# Patient Record
Sex: Female | Born: 1945 | Race: White | Hispanic: No | Marital: Married | Smoking: Never smoker
Health system: Southern US, Community
[De-identification: ages and names within clinical notes are randomized; demographics above are authoritative.]

## PROBLEM LIST (undated history)

## (undated) DIAGNOSIS — E039 Hypothyroidism, unspecified: Secondary | ICD-10-CM

## (undated) DIAGNOSIS — R5383 Other fatigue: Secondary | ICD-10-CM

## (undated) DIAGNOSIS — N950 Postmenopausal bleeding: Secondary | ICD-10-CM

## (undated) DIAGNOSIS — E559 Vitamin D deficiency, unspecified: Secondary | ICD-10-CM

## (undated) DIAGNOSIS — J309 Allergic rhinitis, unspecified: Secondary | ICD-10-CM

## (undated) DIAGNOSIS — R87619 Unspecified abnormal cytological findings in specimens from cervix uteri: Secondary | ICD-10-CM

## (undated) DIAGNOSIS — E279 Disorder of adrenal gland, unspecified: Secondary | ICD-10-CM

## (undated) DIAGNOSIS — M8580 Other specified disorders of bone density and structure, unspecified site: Secondary | ICD-10-CM

## (undated) DIAGNOSIS — L853 Xerosis cutis: Secondary | ICD-10-CM

## (undated) DIAGNOSIS — N9489 Other specified conditions associated with female genital organs and menstrual cycle: Secondary | ICD-10-CM

## (undated) DIAGNOSIS — B3782 Candidal enteritis: Secondary | ICD-10-CM

## (undated) DIAGNOSIS — Z91018 Allergy to other foods: Secondary | ICD-10-CM

## (undated) DIAGNOSIS — N6019 Diffuse cystic mastopathy of unspecified breast: Secondary | ICD-10-CM

## (undated) DIAGNOSIS — G4700 Insomnia, unspecified: Secondary | ICD-10-CM

## (undated) DIAGNOSIS — Z8619 Personal history of other infectious and parasitic diseases: Secondary | ICD-10-CM

## (undated) DIAGNOSIS — R21 Rash and other nonspecific skin eruption: Secondary | ICD-10-CM

## (undated) HISTORY — DX: Rash and other nonspecific skin eruption: R21

## (undated) HISTORY — DX: Other specified disorders of bone density and structure, unspecified site: M85.80

## (undated) HISTORY — DX: Postmenopausal bleeding: N95.0

## (undated) HISTORY — DX: Allergy to other foods: Z91.018

## (undated) HISTORY — DX: Personal history of other infectious and parasitic diseases: Z86.19

## (undated) HISTORY — DX: Other specified conditions associated with female genital organs and menstrual cycle: N94.89

## (undated) HISTORY — DX: Vitamin D deficiency, unspecified: E55.9

## (undated) HISTORY — DX: Hypothyroidism, unspecified: E03.9

## (undated) HISTORY — PX: DILATION AND CURETTAGE OF UTERUS: SHX78

## (undated) HISTORY — DX: Disorder of adrenal gland, unspecified: E27.9

## (undated) HISTORY — PX: TONSILLECTOMY AND ADENOIDECTOMY: SHX28

## (undated) HISTORY — DX: Other fatigue: R53.83

## (undated) HISTORY — DX: Unspecified abnormal cytological findings in specimens from cervix uteri: R87.619

## (undated) HISTORY — DX: Diffuse cystic mastopathy of unspecified breast: N60.19

## (undated) HISTORY — DX: Insomnia, unspecified: G47.00

## (undated) HISTORY — DX: Xerosis cutis: L85.3

## (undated) HISTORY — DX: Allergic rhinitis, unspecified: J30.9

## (undated) HISTORY — DX: Candidal enteritis: B37.82

---
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
## 1997-10-06 ENCOUNTER — Emergency Department (HOSPITAL_COMMUNITY): Admission: EM | Admit: 1997-10-06 | Discharge: 1997-10-06 | Payer: Self-pay

## 2004-01-09 ENCOUNTER — Other Ambulatory Visit: Admission: RE | Admit: 2004-01-09 | Discharge: 2004-01-09 | Payer: Self-pay | Admitting: Family Medicine

## 2004-11-07 ENCOUNTER — Encounter: Admission: RE | Admit: 2004-11-07 | Discharge: 2004-11-07 | Payer: Self-pay | Admitting: Internal Medicine

## 2008-06-29 ENCOUNTER — Other Ambulatory Visit: Admission: RE | Admit: 2008-06-29 | Discharge: 2008-06-29 | Payer: Self-pay | Admitting: Family Medicine

## 2008-12-24 ENCOUNTER — Ambulatory Visit: Payer: Self-pay | Admitting: Diagnostic Radiology

## 2008-12-24 ENCOUNTER — Emergency Department (HOSPITAL_BASED_OUTPATIENT_CLINIC_OR_DEPARTMENT_OTHER): Admission: EM | Admit: 2008-12-24 | Discharge: 2008-12-24 | Payer: Self-pay | Admitting: Emergency Medicine

## 2010-06-12 LAB — URINALYSIS, ROUTINE W REFLEX MICROSCOPIC
Glucose, UA: NEGATIVE mg/dL
Hgb urine dipstick: NEGATIVE
Specific Gravity, Urine: 1.005 (ref 1.005–1.030)
Urobilinogen, UA: 0.2 mg/dL (ref 0.0–1.0)
pH: 7.5 (ref 5.0–8.0)

## 2010-06-12 LAB — COMPREHENSIVE METABOLIC PANEL
AST: 34 U/L (ref 0–37)
Albumin: 4.9 g/dL (ref 3.5–5.2)
Alkaline Phosphatase: 93 U/L (ref 39–117)
CO2: 23 mEq/L (ref 19–32)
Chloride: 101 mEq/L (ref 96–112)
Total Protein: 8.2 g/dL (ref 6.0–8.3)

## 2010-06-12 LAB — DIFFERENTIAL
Basophils Absolute: 0.1 10*3/uL (ref 0.0–0.1)
Basophils Relative: 1 % (ref 0–1)
Eosinophils Absolute: 0.4 10*3/uL (ref 0.0–0.7)
Eosinophils Relative: 4 % (ref 0–5)
Lymphocytes Relative: 38 % (ref 12–46)
Monocytes Relative: 8 % (ref 3–12)
Neutro Abs: 4.2 10*3/uL (ref 1.7–7.7)
Neutrophils Relative %: 49 % (ref 43–77)

## 2010-06-12 LAB — URINE CULTURE: Colony Count: NO GROWTH

## 2010-06-12 LAB — POCT CARDIAC MARKERS
CKMB, poc: 1.3 ng/mL (ref 1.0–8.0)
Myoglobin, poc: 62.7 ng/mL (ref 12–200)

## 2010-06-12 LAB — CBC
Platelets: 243 10*3/uL (ref 150–400)
WBC: 8.8 10*3/uL (ref 4.0–10.5)

## 2010-06-12 LAB — TSH: TSH: 4.717 u[IU]/mL — ABNORMAL HIGH (ref 0.350–4.500)

## 2010-06-12 LAB — URINE MICROSCOPIC-ADD ON

## 2013-01-02 ENCOUNTER — Ambulatory Visit (INDEPENDENT_AMBULATORY_CARE_PROVIDER_SITE_OTHER): Payer: Medicare Other | Admitting: Family Medicine

## 2013-01-02 DIAGNOSIS — T6111XA Scombroid fish poisoning, accidental (unintentional), initial encounter: Secondary | ICD-10-CM

## 2013-01-02 DIAGNOSIS — T6191XA Toxic effect of unspecified seafood, accidental (unintentional), initial encounter: Secondary | ICD-10-CM

## 2013-01-02 DIAGNOSIS — T382X4A Poisoning by antithyroid drugs, undetermined, initial encounter: Secondary | ICD-10-CM

## 2013-01-02 DIAGNOSIS — R21 Rash and other nonspecific skin eruption: Secondary | ICD-10-CM

## 2013-01-02 MED ORDER — PREDNISONE 20 MG PO TABS: 20.0000 mg | ORAL_TABLET | Freq: Every day | ORAL | Status: DC

## 2013-01-02 NOTE — Patient Instructions (Signed)
Take the combination of Benadryl and Ranitidine (Pepcid).  Take the Benadryl at night to help with sleep.  Take Allegra or Zyrtec in the day as this won't make you sleepy.    Do this for about a week.  I will send in steroids for you as well if you need them.  It's only for 5 days.  It could either be scombroid poisoning or an allergic reaction to iodine.  Checking thyroid today.    Call if no improvement in 48 hours.  If you have any trouble breathing, mouth tingling or swelling, go straight to the emergency room.

## 2013-01-02 NOTE — Progress Notes (Signed)
Taylor Bolton is a 67 y.o. female who presents to Urgent Care today with complaints of Rash:    1.  Rash:  Present for past 3 weeks.  Started on hands BL.  Had eaten shrimp earlier that day.  Stated total seafood diet plus high dose fish oil and rash quickly spread to arms, upper chest, breasts, and face.  Itching and burning.  Has tried several OTC supplements without relief, cannot recall all of them.  No antihistamines.  No nausea, vomiting.  Also taking iodine supplements and iodine salts during this time.   No trouble breathing, itching in mouth, tingling, swelling, trouble with airway.  No change/new meds.  Still taking levothroid through this.    PMH reviewed.  Past Medical History  Diagnosis Date  . Hypothyroidism    History reviewed. No pertinent past surgical history.  Medications reviewed. Current Outpatient Prescriptions  Medication Sig Dispense Refill  . levothyroxine (SYNTHROID, LEVOTHROID) 88 MCG tablet Take 88 mcg by mouth daily before breakfast.      . progesterone (PROMETRIUM) 100 MG capsule Take 100 mg by mouth daily.       No current facility-administered medications for this visit.    ROS as above otherwise neg.  No chest pain, palpitations, SOB, Fever, Chills, Abd pain, N/V/D.   Physical Exam:  BP 118/64  Pulse 82  Temp(Src) 99.2 F (37.3 C)  Resp 18  Ht 5\' 5"  (1.651 m)  Wt 130 lb (58.968 kg)  BMI 21.63 kg/m2  SpO2 100% Gen:  Alert, cooperative patient who appears stated age in no acute distress.  Vital signs reviewed. HEENT: EOMI,  MMM.  No exfoliation of oral mucosa.   Pulm:  Clear to auscultation bilaterally with good air movement.  No wheezes or rales noted.   Cardiac:  Regular rate and rhythm without murmur auscultated.  Good S1/S2. Skin:  Urticarial rash throughout hands, arms, shoulders, uper breast, face.  Spares bridge of nose and cheeks.  Present on forehead.  No exfolation noted.  Spares mouth.  Spares legs and abdomen and most of back.  Nurse  chaperone present for entire exam.  Assessment and Plan:  1.  Rash: - scombroid vs iodine allergy, treatment same for each - will add iodine to allergy list for safety - check TSH, not likely to cause thyroid issues, but sounds like she's had significant intake - histamines plus steroids - call if no improvement or worsening.  - no red flags as no mucosal invovlement.

## 2013-01-03 LAB — TSH: TSH: 5.404 u[IU]/mL — ABNORMAL HIGH (ref 0.350–4.500)

## 2013-01-08 ENCOUNTER — Telehealth: Payer: Self-pay

## 2013-01-08 NOTE — Telephone Encounter (Signed)
Please advise 

## 2013-01-08 NOTE — Telephone Encounter (Signed)
PATIENT STATES SHE SAW DR. Gwendolyn Grant LAST Monday FOR A RASH. HE PRESCRIBED HER 5 DAYS OF PREDNISONE, BUT WHEN SHE GOT THE PRESCRIPTION FILLED IT SAID SHE IS TO TAKE IT FOR 10 DAYS. SHE WOULD LIKE TO KNOW WHICH WAY IS CORRECT? BEST PHONE [phone] (CELL)   PHARMACY CHOICE IS [facility] [address].  MBC

## 2013-01-09 NOTE — Telephone Encounter (Signed)
Called her to advise to only take for 5 days.

## 2013-01-09 NOTE — Telephone Encounter (Signed)
Pt instructions indicate that pt was take for 5 days only.  Would follow those instructions.

## 2013-02-03 ENCOUNTER — Other Ambulatory Visit: Payer: Self-pay | Admitting: Family Medicine

## 2013-02-12 ENCOUNTER — Encounter: Payer: Self-pay | Admitting: Family Medicine

## 2013-02-12 ENCOUNTER — Ambulatory Visit (INDEPENDENT_AMBULATORY_CARE_PROVIDER_SITE_OTHER): Payer: Medicare Other | Admitting: Family Medicine

## 2013-02-12 VITALS — BP 100/64 | HR 92 | Temp 97.7°F | Resp 16 | Ht 65.5 in | Wt 126.4 lb

## 2013-02-12 DIAGNOSIS — L239 Allergic contact dermatitis, unspecified cause: Secondary | ICD-10-CM

## 2013-02-12 DIAGNOSIS — L259 Unspecified contact dermatitis, unspecified cause: Secondary | ICD-10-CM

## 2013-02-12 MED ORDER — PREDNISONE 20 MG PO TABS: ORAL_TABLET | ORAL | Status: DC

## 2013-02-12 MED ORDER — FLUOCINONIDE-E 0.05 % EX CREA: 1.0000 "application " | TOPICAL_CREAM | Freq: Two times a day (BID) | CUTANEOUS | Status: DC

## 2013-02-12 NOTE — Progress Notes (Signed)
° °  Subjective:  This chart was scribed for Elvina Sidle, MD by Carl Best, Medical Scribe. This patient was seen in Room 9 and the patient's care was started at 11:43 AM.  Patient ID: Taylor Bolton, female    DOB: November 04, 1945, 67 y.o.   MRN: [id number]  HPI HPI Comments: Taylor Bolton is a 67 y.o. female who [facility] complaining of an itchy, erythematous rash located on her bilateral arms, bilateral shoulders, neck, and back caused by an allergic reaction to shellfish that started yesterday.  She states that she had the shellfish on Friday evening.  She states that she has taken Benadryl with no relief to her symptoms.  She denies tongue and throat swelling as associated symptoms.  She states that she had similar symptoms a couple of months ago and was given Prednisone pills for her symptoms.  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    Past Medical History  Diagnosis Date   Hypothyroidism    No past surgical history on file. Family History  Problem Relation Age of Onset   Cancer Mother    Cancer Father    History   Social History   Marital Status: Married    Spouse Name: N/A    Number of Children: N/A   Years of Education: N/A   Occupational History   Not on file.   Social History Main Topics   Smoking status: Never Smoker    Smokeless tobacco: Not on file   Alcohol Use: Not on file   Drug Use: Not on file   Sexual Activity: Not on file   Other Topics Concern   Not on file   Social History Narrative   No narrative on file   Allergies  Allergen Reactions   Iodine (Kelp) [Iodine]    Sudafed [Pseudoephedrine Hcl]      Review of Systems  HENT: Negative for facial swelling and trouble swallowing.   Skin: Positive for color change and rash.  All other systems reviewed and are negative.     Objective:  Physical Exam No acute distress Marked eczematous changes with excoriated skin both upper  extremities all the way to the shoulder and including shoulders, neck, and face. Skin is crying and  intensely red Oropharynx: Clear Chest: Clear   Assessment & Plan:   I personally performed the services described in this documentation, which was scribed in my presence. The recorded information has been reviewed and is accurate.  Changes consistent with having had shellfish in developing an eczematous, allergic reaction   Allergic dermatitis - Plan: predniSONE (DELTASONE) 20 MG tablet, fluocinonide-emollient (LIDEX-E) 0.05 % cream  Signed, Elvina Sidle, MD

## 2013-02-12 NOTE — Patient Instructions (Signed)

## 2013-10-19 ENCOUNTER — Encounter: Payer: Self-pay | Admitting: Family Medicine

## 2013-11-21 ENCOUNTER — Encounter: Payer: Self-pay | Admitting: Family Medicine

## 2013-11-21 ENCOUNTER — Ambulatory Visit (INDEPENDENT_AMBULATORY_CARE_PROVIDER_SITE_OTHER): Payer: Medicare HMO | Admitting: Family Medicine

## 2013-11-21 VITALS — BP 125/73 | HR 64 | Temp 98.4°F | Resp 18 | Ht 64.5 in | Wt 125.0 lb

## 2013-11-21 DIAGNOSIS — N959 Unspecified menopausal and perimenopausal disorder: Secondary | ICD-10-CM

## 2013-11-21 DIAGNOSIS — E039 Hypothyroidism, unspecified: Secondary | ICD-10-CM

## 2013-11-21 DIAGNOSIS — N951 Menopausal and female climacteric states: Secondary | ICD-10-CM

## 2013-11-21 NOTE — Assessment & Plan Note (Signed)
Continue hormone rx as per Dr. Alessandra Bevels, her integrative med MD. I told pt to request pap smear by Dr. Alessandra Bevels for cervical cancer screening since she rx's her hormones.

## 2013-11-21 NOTE — Progress Notes (Signed)
Pre visit review using our clinic review tool, if applicable. No additional management support is needed unless otherwise documented below in the visit note. 

## 2013-11-21 NOTE — Assessment & Plan Note (Signed)
Last TSH 9 mo ago was great/wnl. Continue synthroid 88 mcg qd. We'll recheck level in 6 mo if she has not already had this done via Dr. Martyn Ehrich office.

## 2013-11-21 NOTE — Progress Notes (Signed)
Office Note 11/21/2013  CC:  Chief Complaint  Patient presents with  . Establish Care    HPI:  Taylor Bolton is a 68 y.o. White female who is here to establish care. Patient's most recent primary MD: Dr. Alessandra Bevels, integrative medicine--she will still see her for an annual visit and her hormone prescription. Old records in [facility] EMR were reviewed prior to or during today's visit.  She has no acute complaints today.  Past Medical History  Diagnosis Date  . Hypothyroidism   . Rash and nonspecific skin eruption     Initially treated as allergic dermatitis but didn't help; then her integrative med MD dx'd it as some sort of yeast infection and it cleared up with diflucan  . Allergic rhinitis     Perennial; with PND; treats with "natural" supplements    Past Surgical History  Procedure Laterality Date  . Tonsillectomy and adenoidectomy  1950s    Family History  Problem Relation Age of Onset  . Breast cancer Mother     dx'd in her 83s.  . Prostate cancer Father     History   Social History  . Marital Status: Married    Spouse Name: N/A    Number of Children: N/A  . Years of Education: N/A   Occupational History  . Not on file.   Social History Main Topics  . Smoking status: Never Smoker   . Smokeless tobacco: Never Used  . Alcohol Use: No  . Drug Use: No  . Sexual Activity: Not on file   Other Topics Concern  . Not on file   Social History Narrative   Married, 1 son and 1 daughter.  1 grandson.   Retired Interior and spatial designer.   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   No T/A/Ds.   Exercise: nothing consistent   MEDS: Levothyroxine 88 mcg qd,  Also takes biest , (estriol testosterone )--rx'd by her integrative med MD  Allergies  Allergen Reactions  . Sudafed [Pseudoephedrine Hcl]     ROS Review of Systems  Constitutional: Negative for fever and fatigue.  HENT: Negative for congestion and sore throat.   Eyes: Negative for visual disturbance.  Respiratory:  Negative for cough.   Cardiovascular: Negative for chest pain.  Gastrointestinal: Negative for nausea and abdominal pain.  Genitourinary: Negative for dysuria.  Musculoskeletal: Negative for back pain and joint swelling.  Skin: Negative for rash.  Neurological: Negative for weakness and headaches.  Hematological: Negative for adenopathy.    PE; Blood pressure 125/73, pulse 64, temperature 98.4 F (36.9 C), temperature source Temporal, resp. rate 18, height 5' 4.5" (1.638 m), weight 125 lb (56.7 kg), SpO2 96.00%. Gen: Alert, well appearing.  Patient is oriented to person, place, time, and situation. WUJ:WJXB: no injection, icteris, swelling, or exudate.  EOMI, PERRLA. Mouth: lips without lesion/swelling.  Oral mucosa pink and moist. Oropharynx without erythema, exudate, or swelling.  Neck: supple/nontender.  No LAD, mass, or TM.  Carotid pulses 2+ bilaterally, without bruits. CV: RRR, no m/r/g.   LUNGS: CTA bilat, nonlabored resps, good aeration in all lung fields. EXT: no clubbing, cyanosis, or edema.   Pertinent labs:  Reviewed latest labs from Integrative med MD, Dr. Alessandra Bevels (03/13/2013): CMET, TSH, free T4, T3, DHEA sulfate, vit D, Vit A, histamine levels, reverse T3, and glutathione:  All wnl.  ASSESSMENT AND PLAN:   New pt; here to establish care.  Will obtain old records.  Hypothyroidism Last TSH 9 mo ago was great/wnl. Continue synthroid 88 mcg qd. We'll  recheck level in 6 mo if she has not already had this done via Dr. Martyn Ehrich office.  Postmenopausal disorder Continue hormone rx as per Dr. Alessandra Bevels, her integrative med MD. I told pt to request pap smear by Dr. Alessandra Bevels for cervical cancer screening since she rx's her hormones.   When pt returns for AWV I'll give her iFOB (she declines colonoscopy but agrees to iFOB). She declines cholesterol screening.  She does not want to pursue mammography at this time (last mammogram was about 2010.  She had a "thermogram" about 2012  which she reports was normal).  An After Visit Summary was printed and given to the patient.  Return in about 6 months (around 05/22/2014) for annual medicare wellness visit.

## 2013-12-13 ENCOUNTER — Encounter: Payer: Self-pay | Admitting: Family Medicine

## 2013-12-19 ENCOUNTER — Encounter: Payer: Self-pay | Admitting: Family Medicine

## 2014-01-02 ENCOUNTER — Ambulatory Visit
Admission: RE | Admit: 2014-01-02 | Discharge: 2014-01-02 | Disposition: A | Discharge status: Home / Self Care | Payer: Medicare HMO | Source: Ambulatory Visit | Attending: *Deleted | Admitting: *Deleted

## 2014-01-02 ENCOUNTER — Other Ambulatory Visit: Payer: Self-pay | Admitting: *Deleted

## 2014-01-02 DIAGNOSIS — J069 Acute upper respiratory infection, unspecified: Secondary | ICD-10-CM

## 2014-02-21 ENCOUNTER — Telehealth: Payer: Self-pay | Admitting: Family Medicine

## 2014-02-22 NOTE — Telephone Encounter (Signed)
Patient advised that our 1st available new patient appointment is not until March. Patient states that she will try somewhere else first.

## 2014-03-12 ENCOUNTER — Ambulatory Visit: Payer: Medicare HMO | Admitting: Nurse Practitioner

## 2014-03-16 ENCOUNTER — Encounter: Payer: Self-pay | Admitting: Nurse Practitioner

## 2014-03-16 ENCOUNTER — Ambulatory Visit (INDEPENDENT_AMBULATORY_CARE_PROVIDER_SITE_OTHER): Payer: Medicare HMO | Admitting: Nurse Practitioner

## 2014-03-16 ENCOUNTER — Other Ambulatory Visit (HOSPITAL_COMMUNITY)
Admission: RE | Admit: 2014-03-16 | Discharge: 2014-03-16 | Disposition: A | Discharge status: Home / Self Care | Payer: Medicare HMO | Source: Ambulatory Visit | Attending: Nurse Practitioner | Admitting: Nurse Practitioner

## 2014-03-16 VITALS — BP 106/68 | HR 74 | Temp 98.2°F | Ht 64.5 in | Wt 127.0 lb

## 2014-03-16 DIAGNOSIS — Z124 Encounter for screening for malignant neoplasm of cervix: Secondary | ICD-10-CM | POA: Insufficient documentation

## 2014-03-16 DIAGNOSIS — Z1151 Encounter for screening for human papillomavirus (HPV): Secondary | ICD-10-CM | POA: Diagnosis present

## 2014-03-16 DIAGNOSIS — Z1239 Encounter for other screening for malignant neoplasm of breast: Secondary | ICD-10-CM | POA: Insufficient documentation

## 2014-03-16 DIAGNOSIS — N8110 Cystocele, unspecified: Secondary | ICD-10-CM

## 2014-03-16 NOTE — Progress Notes (Signed)
Pre visit review using our clinic review tool, if applicable. No additional management support is needed unless otherwise documented below in the visit note. 

## 2014-03-16 NOTE — Progress Notes (Signed)
Subjective:     Taylor Bolton is a 69 y.o. female [facility]. The patient reports no problems. [facility]. Had MMG few years ago, then thermogram -recalls nml. She sees Dr Alessandra Bevelsvaughn for HRT-uses estrogen vaginal cream & progesterone patches. Estrogen cream has helped w/vaginal atrophy/dryness/discomfort. Pertinent fam Hx: mother breast ca in 3070's.  History   Social History  . Marital Status: Married    Spouse Name: N/A    Number of Children: 2  . Years of Education: N/A   Occupational History  . Not on file.   Social History Main Topics  . Smoking status: Never Smoker   . Smokeless tobacco: Never Used  . Alcohol Use: No  . Drug Use: No  . Sexual Activity: Not on file   Other Topics Concern  . Not on file   Social History Narrative   Married, 1 son and 1 daughter.  1 grandson.   Retired Interior and spatial designerhairdresser.   [place] from [place]   No T/A/Ds.   Exercise: nothing consistent   Health Maintenance  Topic Date Due  . TETANUS/TDAP  10/17/1966  . MAMMOGRAM  10/16/1997  . COLONOSCOPY  10/16/1997  . ZOSTAVAX  10/17/2007  . INFLUENZA VACCINE  06/07/2014 (Originally 10/07/2013)  . PNEUMOCOCCAL POLYSACCHARIDE VACCINE AGE 73 AND OVER  06/06/2068 (Originally 10/16/2012)  . DEXA SCAN  Completed    The following portions of the patient's history were reviewed and updated as appropriate: allergies, current medications, past family history, past medical history, past social history, past surgical history and problem list.  Review of Systems Constitutional: negative for fatigue, fevers, night sweats and sweats Respiratory: negative for cough and dyspnea on exertion Cardiovascular: negative for irregular heart beat and lower extremity edema Genitourinary:positive for stress incontinence   Objective:    BP 106/68 mmHg  Pulse 74  Temp(Src) 98.2 F (36.8 C) (Oral)  Ht 5' 4.5" (1.638 m)  Wt 127 lb (57.607 kg)  BMI 21.47 kg/m2  SpO2 99% General  appearance: alert, cooperative, appears stated age and no distress Head: Normocephalic, without obvious abnormality, atraumatic Eyes: negative findings: lids and lashes normal and conjunctivae and sclerae normal Lungs: clear to auscultation bilaterally Breasts: No nipple retraction or dimpling, No nipple discharge or bleeding, No axillary or supraclavicular adenopathy, some ropey, ovoid tissue palpated bilat Heart: regular rate and rhythm, S1, S2 normal, no murmur, click, rub or gallop Pelvic: adnexae not palpable, cervix normal in appearance, external genitalia normal, no adnexal masses or tenderness, no bladder tenderness, no cervical motion tenderness, perianal skin: no external genital warts noted, uterus normal size, shape, and consistency, vagina normal without discharge and mild bladder prolapse    Assessment:Plan   1. Cervical cancer screening - Cytology - PAP  2. Breast cancer screening - MM DIGITAL SCREENING BILATERAL; Future  3. Bladder prolapse, female, acquired kegels 12-16-08 daily Bladder training-empty q2h  See pt instructions Needs wellness exam w/Dr Milinda CaveMcGowen

## 2014-03-16 NOTE — Patient Instructions (Signed)
My office will call with lab results.  Consider scheduling mammogram for earliest cancer screening & best treatment outcomes.  Consider scheduling wellness exam w/Dr McGowen.  Nice to meet you!

## 2014-03-19 ENCOUNTER — Telehealth: Payer: Self-pay | Admitting: Nurse Practitioner

## 2014-03-19 LAB — CYTOLOGY - PAP

## 2014-03-19 NOTE — Telephone Encounter (Signed)
pls call pt: Advise Pap was normal.

## 2014-03-19 NOTE — Telephone Encounter (Signed)
Patient notified of results.

## 2014-03-22 ENCOUNTER — Ambulatory Visit
Admission: RE | Admit: 2014-03-22 | Discharge: 2014-03-22 | Disposition: A | Discharge status: Home / Self Care | Payer: Medicare HMO | Source: Ambulatory Visit | Attending: Nurse Practitioner | Admitting: Nurse Practitioner

## 2014-03-22 DIAGNOSIS — Z1239 Encounter for other screening for malignant neoplasm of breast: Secondary | ICD-10-CM

## 2014-03-28 ENCOUNTER — Other Ambulatory Visit: Payer: Self-pay | Admitting: Nurse Practitioner

## 2014-03-28 DIAGNOSIS — R928 Other abnormal and inconclusive findings on diagnostic imaging of breast: Secondary | ICD-10-CM

## 2014-04-05 ENCOUNTER — Ambulatory Visit
Admission: RE | Admit: 2014-04-05 | Discharge: 2014-04-05 | Disposition: A | Discharge status: Home / Self Care | Payer: Medicare HMO | Source: Ambulatory Visit | Attending: Nurse Practitioner | Admitting: Nurse Practitioner

## 2014-04-05 DIAGNOSIS — R928 Other abnormal and inconclusive findings on diagnostic imaging of breast: Secondary | ICD-10-CM

## 2014-05-31 ENCOUNTER — Ambulatory Visit: Payer: Medicare HMO | Admitting: Family Medicine

## 2014-06-29 ENCOUNTER — Encounter: Payer: Self-pay | Admitting: Family Medicine

## 2014-06-29 ENCOUNTER — Ambulatory Visit (INDEPENDENT_AMBULATORY_CARE_PROVIDER_SITE_OTHER): Payer: Medicare HMO | Admitting: Family Medicine

## 2014-06-29 VITALS — BP 106/66 | HR 66 | Temp 98.8°F | Resp 18 | Ht 64.5 in | Wt 124.0 lb

## 2014-06-29 DIAGNOSIS — Z79899 Other long term (current) drug therapy: Secondary | ICD-10-CM

## 2014-06-29 DIAGNOSIS — Z13220 Encounter for screening for lipoid disorders: Secondary | ICD-10-CM

## 2014-06-29 DIAGNOSIS — Z0000 Encounter for general adult medical examination without abnormal findings: Secondary | ICD-10-CM | POA: Diagnosis not present

## 2014-06-29 DIAGNOSIS — Z13820 Encounter for screening for osteoporosis: Secondary | ICD-10-CM

## 2014-06-29 DIAGNOSIS — M8580 Other specified disorders of bone density and structure, unspecified site: Secondary | ICD-10-CM

## 2014-06-29 DIAGNOSIS — Z131 Encounter for screening for diabetes mellitus: Secondary | ICD-10-CM | POA: Diagnosis not present

## 2014-06-29 LAB — COMPREHENSIVE METABOLIC PANEL
ALK PHOS: 49 U/L (ref 39–117)
ALT: 14 U/L (ref 0–35)
AST: 17 U/L (ref 0–37)
Albumin: 4.3 g/dL (ref 3.5–5.2)
BILIRUBIN TOTAL: 0.7 mg/dL (ref 0.2–1.2)
BUN: 12 mg/dL (ref 6–23)
CO2: 30 meq/L (ref 19–32)
Calcium: 9.5 mg/dL (ref 8.4–10.5)
Chloride: 100 mEq/L (ref 96–112)
Creatinine, Ser: 0.71 mg/dL (ref 0.40–1.20)
GFR: 87.35 mL/min (ref >60.00)
Glucose, Bld: 91 mg/dL (ref 70–99)
Potassium: 4.5 mEq/L (ref 3.5–5.1)
SODIUM: 134 meq/L — AB (ref 135–145)
TOTAL PROTEIN: 6.9 g/dL (ref 6.0–8.3)

## 2014-06-29 LAB — LIPID PANEL
Cholesterol: 204 mg/dL — ABNORMAL HIGH (ref 0–200)
HDL: 73.3 mg/dL (ref >39.00)
LDL Cholesterol: 113 mg/dL — ABNORMAL HIGH (ref 0–99)
NonHDL: 130.7
Total CHOL/HDL Ratio: 3
Triglycerides: 90 mg/dL (ref 0.0–149.0)
VLDL: 18 mg/dL (ref 0.0–40.0)

## 2014-06-29 NOTE — Patient Instructions (Signed)
Today we did screening for diabetes, hypercholesterolemia, and colon cancer (stool test). I also ordered your bone density test to screen for osteoporosis.  I'll note in your chart that you decline HIV screening and pneumonia vaccine at this time.

## 2014-06-29 NOTE — Progress Notes (Signed)
Pre visit review using our clinic review tool, if applicable. No additional management support is needed unless otherwise documented below in the visit note. 

## 2014-06-29 NOTE — Progress Notes (Signed)
The patient is here for annual Medicare wellness examination and management of other chronic and acute problems.   The risk factors are reflected in the social history.  The roster of all physicians providing medical care to patient - is listed in the Snapshot section of the chart. Dr. Mia CreekElizabeth Vaughan is pt's other physician, whom she sees for most of her concerns (she is an integrative medicine MD).  Activities of daily living:  The patient is 100% inedpendent in all ADLs: dressing, toileting, feeding as well as independent mobility  Home safety : The patient has smoke detectors in the home. They wear seatbelts.No firearms at home ( firearms are present in the home, kept in a safe fashion). There is no violence in the home.   There is no risks for hepatitis, STDs or HIV. There is no   history of blood transfusion. They have no travel history to infectious disease endemic areas of the world.  The patient has seen their dentist in the last six month. They have seen their eye doctor in the last year. They deny any hearing difficulty and have not had audiologic testing in the last year.  They do not  have excessive sun exposure. Discussed the need for sun protection: hats, long sleeves and use of sunscreen if there is significant sun exposure.   Diet: the importance of a healthy diet is discussed. They do have a healthy diet.  The patient does not have a regular exercise program.  The benefits of regular aerobic exercise were discussed.  Depression screen: there are no signs or vegative symptoms of depression- irritability, change in appetite, anhedonia, sadness/tearfullness.  Cognitive assessment: the patient manages all their financial and personal affairs and is actively engaged. They could relate day,date,year and events; recalled 3/3 objects at 3 minutes; performed clock-face test normally.  The following portions of the patient's history were reviewed and updated as appropriate:  allergies, current medications, past family history, past medical history,  past surgical history, past social history  and problem list.  Vision, hearing, body mass index were assessed and reviewed.  Filed Vitals:   06/29/14 0910  BP: 106/66  Pulse: 66  Temp: 98.8 F (37.1 C)  Resp: 18  BMI 21   During the course of the visit the patient was educated and counseled about appropriate screening and preventive services including : fall prevention , diabetes screening, nutrition counseling, colorectal cancer screening, and recommended immunizations.  A written screening schedule was given to pt today: Today we did screening for diabetes, hypercholesterolemia, and colon cancer (stool test). I also ordered your bone density test to screen for osteoporosis.  I'll note in your chart that you decline HIV screening and pneumonia vaccine at this time.

## 2014-06-29 NOTE — Addendum Note (Signed)
Addended by: Jeoffrey MassedMCGOWEN, Camdyn Laden H on: 06/29/2014 01:02 PM   Modules accepted: Orders

## 2014-07-18 ENCOUNTER — Ambulatory Visit (INDEPENDENT_AMBULATORY_CARE_PROVIDER_SITE_OTHER)
Admission: RE | Admit: 2014-07-18 | Discharge: 2014-07-18 | Disposition: A | Discharge status: Home / Self Care | Payer: Medicare HMO | Source: Ambulatory Visit | Attending: Family Medicine | Admitting: Family Medicine

## 2014-07-18 DIAGNOSIS — M8580 Other specified disorders of bone density and structure, unspecified site: Secondary | ICD-10-CM | POA: Diagnosis not present

## 2014-09-03 ENCOUNTER — Other Ambulatory Visit: Payer: Self-pay

## 2014-12-27 DIAGNOSIS — E039 Hypothyroidism, unspecified: Secondary | ICD-10-CM | POA: Diagnosis not present

## 2015-01-14 DIAGNOSIS — N951 Menopausal and female climacteric states: Secondary | ICD-10-CM | POA: Diagnosis not present

## 2015-01-14 DIAGNOSIS — E639 Nutritional deficiency, unspecified: Secondary | ICD-10-CM | POA: Diagnosis not present

## 2015-01-14 DIAGNOSIS — E039 Hypothyroidism, unspecified: Secondary | ICD-10-CM | POA: Diagnosis not present

## 2015-01-14 DIAGNOSIS — E559 Vitamin D deficiency, unspecified: Secondary | ICD-10-CM | POA: Diagnosis not present

## 2015-01-14 DIAGNOSIS — D508 Other iron deficiency anemias: Secondary | ICD-10-CM | POA: Diagnosis not present

## 2015-01-21 DIAGNOSIS — B029 Zoster without complications: Secondary | ICD-10-CM | POA: Diagnosis not present

## 2015-02-28 IMAGING — MG MM DIGITAL SCREENING BILAT W/ CAD
4 series · 4 of 4 positions shown · non-contrast
Comparison: No prior films

CLINICAL DATA: Screening.

EXAM:
DIGITAL SCREENING BILATERAL MAMMOGRAM WITH CAD

[L CC]
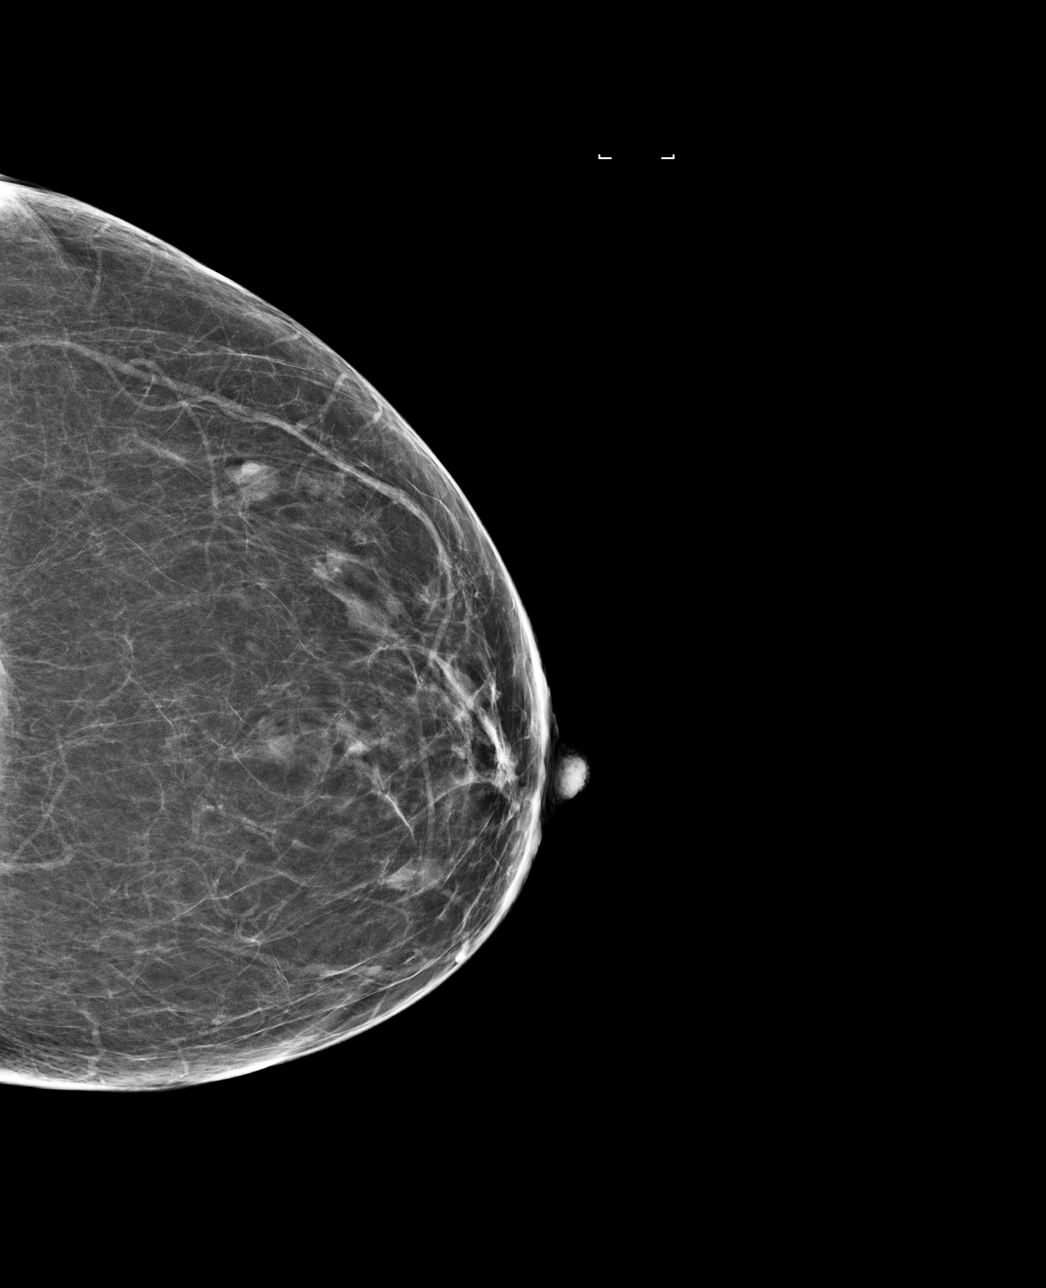

[L MLO]
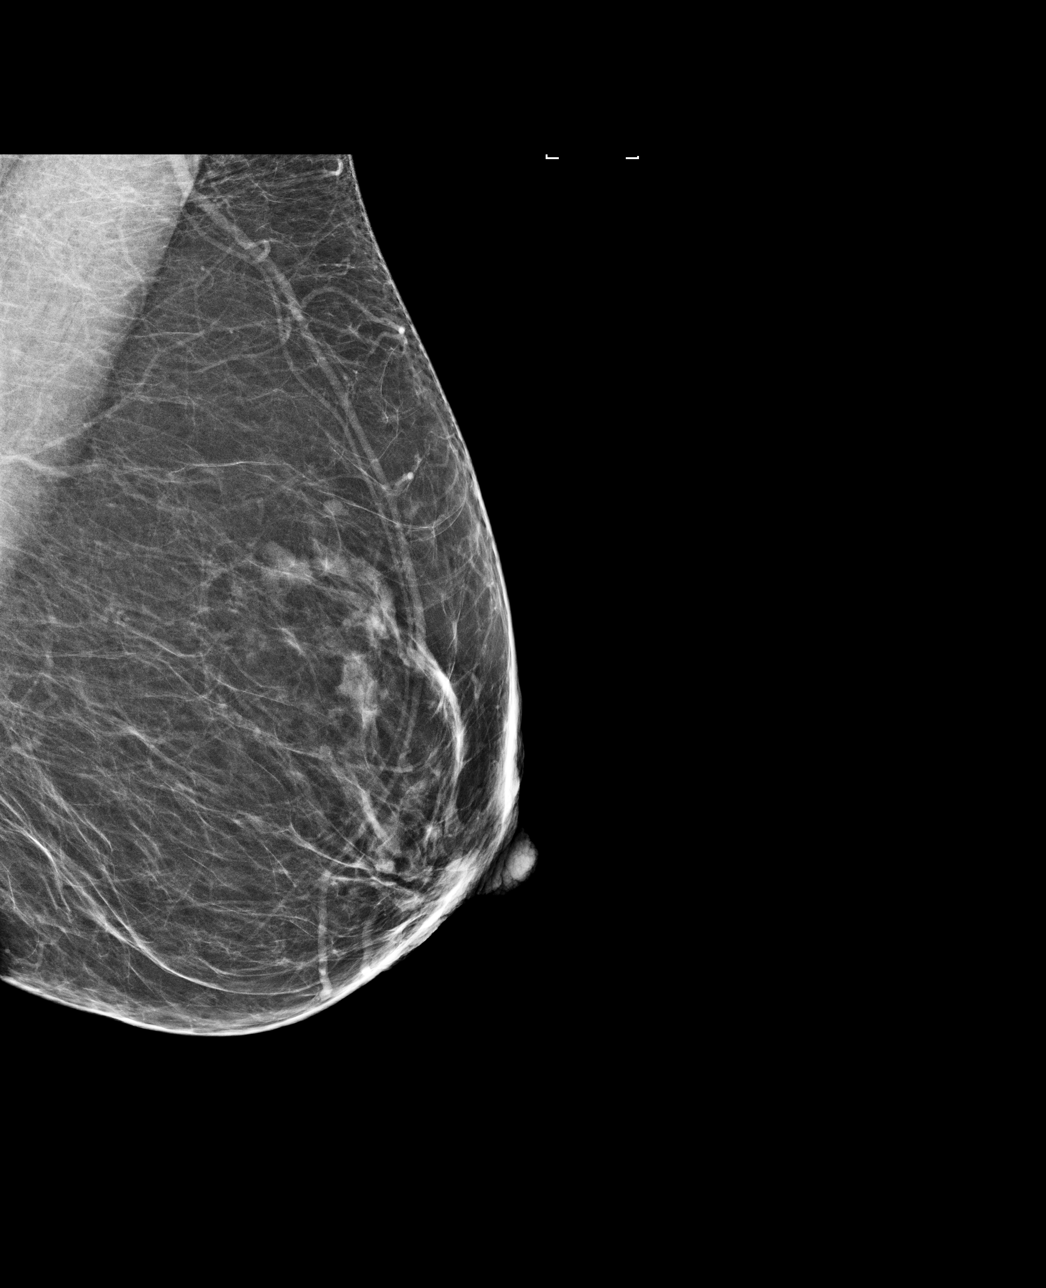

[R CC]
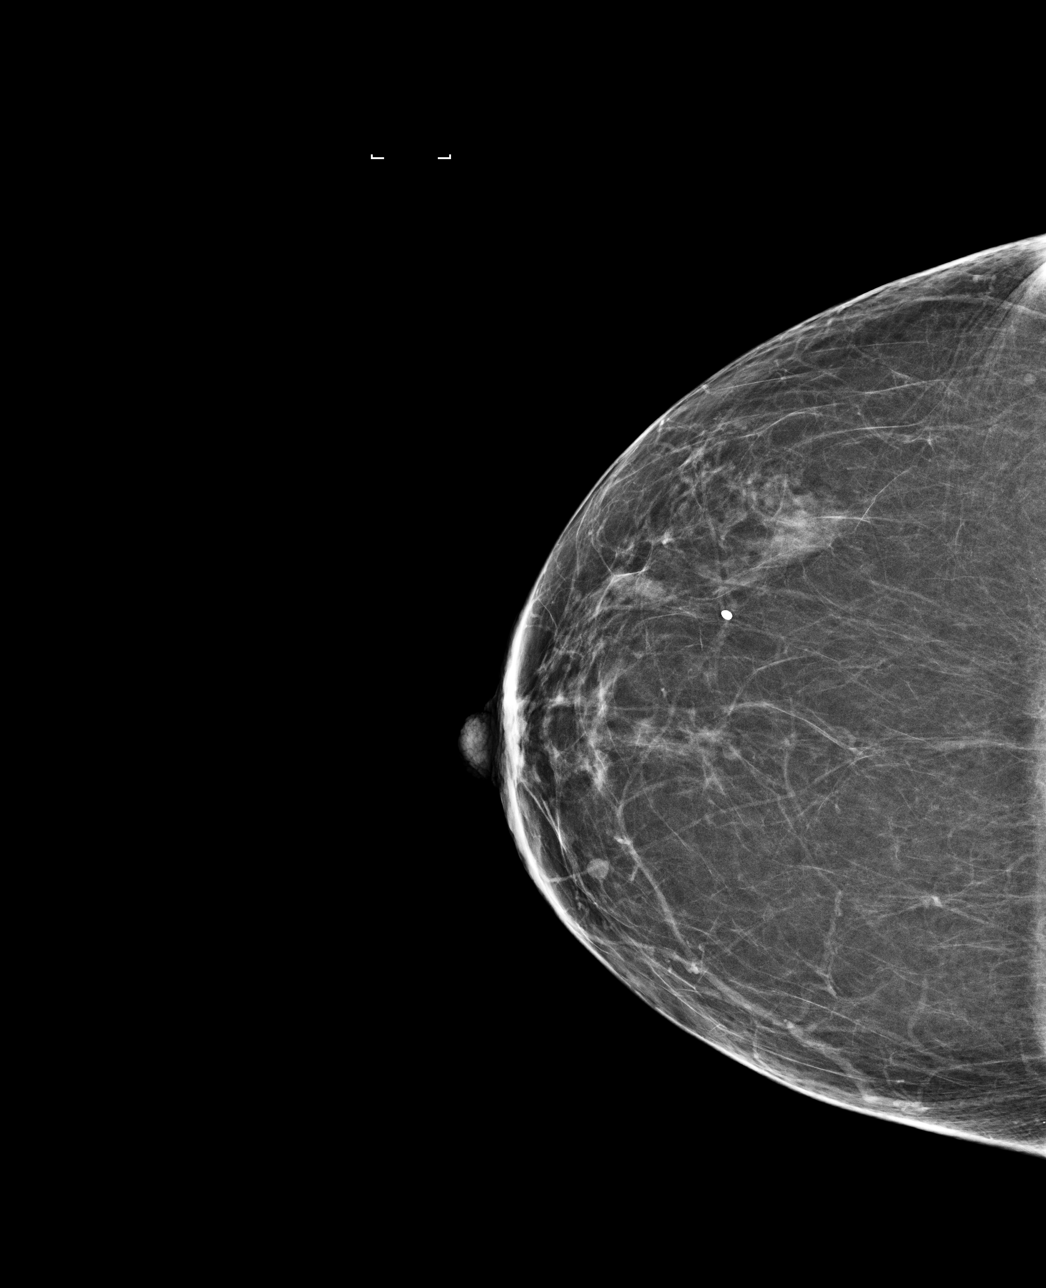

[R MLO]
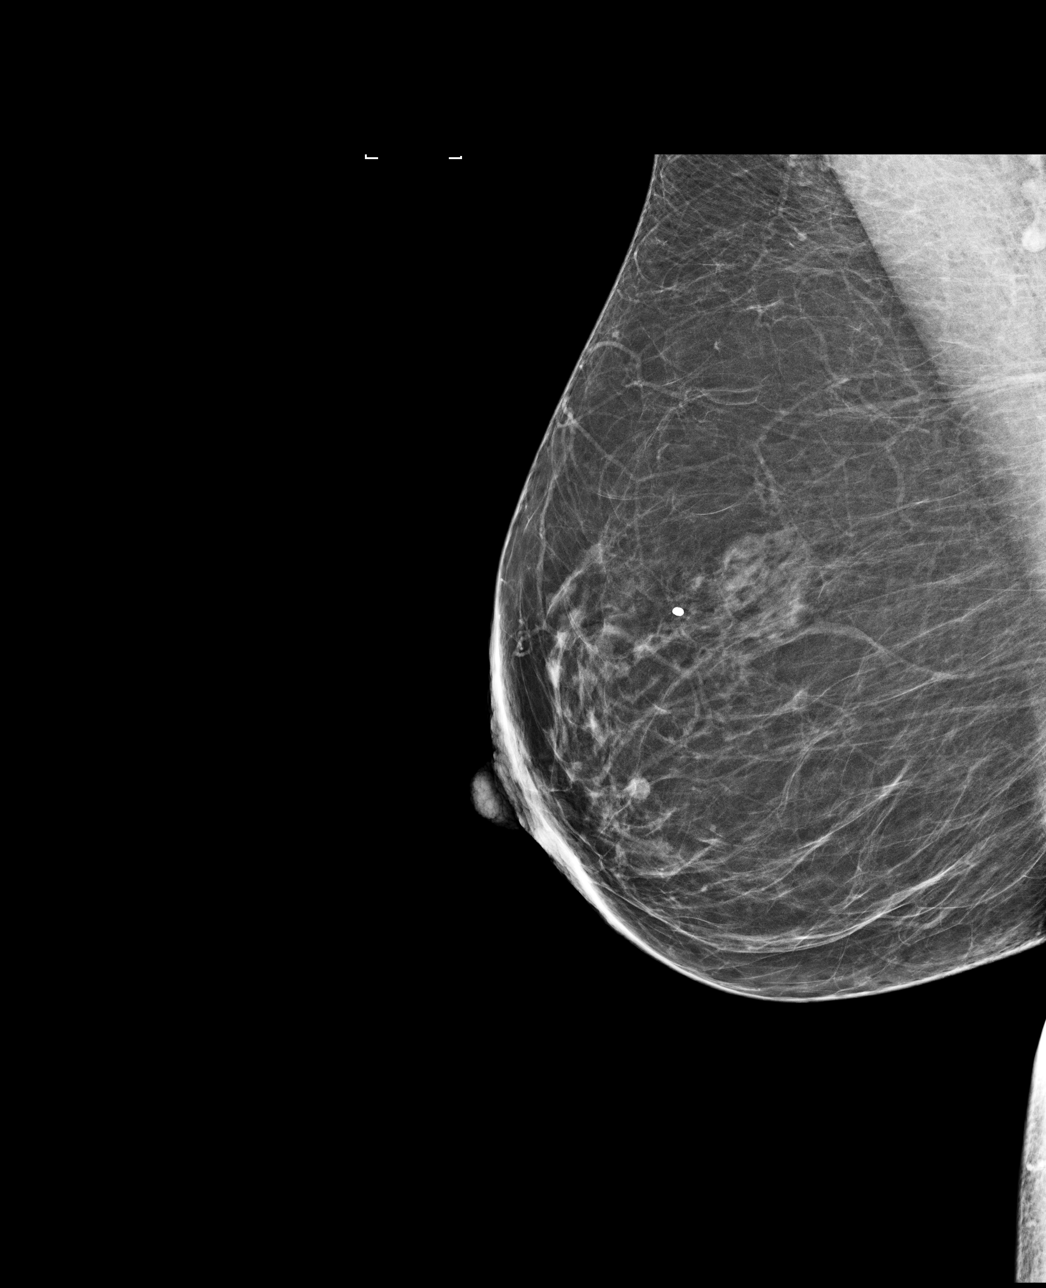

[4 of 4 positions shown; findings below may reference images not displayed]

ACR Breast Density Category b: There are scattered areas of
fibroglandular density.
FINDINGS: Possible masses in bilateral breast require further evaluation with
diagnostic mammogram and possible ultrasound.

Images were processed with CAD.
IMPRESSION: Incomplete

RECOMMENDATION:
Diagnostic mammogram and possibly ultrasound of both breasts.
(Code:S2-0-55D)

The patient will be contacted regarding the findings, and additional
imaging will be scheduled.

BI-RADS CATEGORY  0: Incomplete. Need additional imaging evaluation
and/or prior mammograms for comparison.

## 2015-03-10 ENCOUNTER — Ambulatory Visit (INDEPENDENT_AMBULATORY_CARE_PROVIDER_SITE_OTHER): Payer: Medicare HMO | Admitting: Internal Medicine

## 2015-03-10 VITALS — BP 128/62 | HR 80 | Temp 98.1°F | Resp 16 | Ht 65.5 in | Wt 124.0 lb

## 2015-03-10 DIAGNOSIS — J340 Abscess, furuncle and carbuncle of nose: Secondary | ICD-10-CM

## 2015-03-10 DIAGNOSIS — L239 Allergic contact dermatitis, unspecified cause: Secondary | ICD-10-CM

## 2015-03-10 DIAGNOSIS — H60392 Other infective otitis externa, left ear: Secondary | ICD-10-CM | POA: Diagnosis not present

## 2015-03-10 DIAGNOSIS — L200 Besnier's prurigo: Secondary | ICD-10-CM

## 2015-03-10 DIAGNOSIS — R220 Localized swelling, mass and lump, head: Secondary | ICD-10-CM | POA: Diagnosis not present

## 2015-03-10 MED ORDER — PREDNISONE 20 MG PO TABS: ORAL_TABLET | ORAL | Status: DC

## 2015-03-10 MED ORDER — CEPHALEXIN 500 MG PO CAPS: 500.0000 mg | ORAL_CAPSULE | Freq: Three times a day (TID) | ORAL | Status: DC

## 2015-03-10 MED ORDER — HYDROCORTISONE 2.5 % EX CREA: TOPICAL_CREAM | Freq: Two times a day (BID) | CUTANEOUS | Status: DC

## 2015-03-10 NOTE — Progress Notes (Signed)
Subjective:  By signing my name below, I, Stann Ore, attest that this documentation has been prepared under the direction and in the presence of Ellamae Sia, MD. Electronically Signed: Stann Ore, Scribe. 03/10/2015 , 11:13 AM .  Patient was seen in Room 6 .   Patient ID: Taylor Bolton, female    DOB: 01-29-1946, 70 y.o.   MRN: [id number] Chief Complaint  Patient presents with  . Oral Swelling  . Ear Pain    ear swollen painful also has drainage  Called to see patient urgently out of concern for lip swelling indicating allergic hypersens reaction  HPI Taylor Bolton is a 70 y.o. female who presents to [facility] complaining of lip swelling /redness and pain that started 10 days ago. She went to see the dentist for cavities. They had injected her (believes to be lidocaine) prior to the process. Has had progressive swelling of both lips and the outer nose and the L ear--worse the last 24hr--couldn't open mouth this am. Notes ear pain with ear swelling with drainage. She also noticed a rash starting on her arms today. She denies antibiotics. No New meds.  Patient Active Problem List   Diagnosis Date Noted  . Medicare annual wellness visit, initial 06/29/2014  . Bladder prolapse, female, acquired 03/16/2014  . Breast cancer screening 03/16/2014  . Cervical cancer screening 03/16/2014  . Hypothyroidism 11/21/2013  . Postmenopausal disorder 11/21/2013  Dr Milinda Cave pcp  Current outpatient prescriptions:  .  Estradiol-Estriol-Progesterone (BIEST/PROGESTERONE) CREA, Place onto the skin., Disp: , Rfl:  .  levothyroxine (SYNTHROID, LEVOTHROID) 88 MCG tablet, Take 88 mcg by mouth daily before breakfast., Disp: , Rfl:  .  progesterone (PROMETRIUM) 100 MG capsule, Take 100 mg by mouth daily., Disp: , Rfl:   Review of Systems  Constitutional: Negative for fever, chills, appetite change, fatigue and unexpected weight change.  HENT: Positive for ear discharge, ear pain and facial swelling.  Negative for postnasal drip, rhinorrhea, sore throat and trouble swallowing.   Eyes: Negative for redness and visual disturbance.  Respiratory: Negative for cough, choking, chest tightness, shortness of breath and wheezing.   Cardiovascular: Negative for palpitations.  Gastrointestinal: Negative for nausea, vomiting and diarrhea.  Musculoskeletal: Negative for arthralgias.  Skin: Positive for rash.  Hematological: Does not bruise/bleed easily.       Objective:   Physical Exam  Constitutional: She is oriented to person, place, and time. She appears well-developed and well-nourished.  Obviously uncomfortable but no acute hypersensitivity reaction  HENT:  Head: Normocephalic and atraumatic.  Eyes: Conjunctivae and EOM are normal. Pupils are equal, round, and reactive to light.  Neck: Neck supple.  Cardiovascular: Normal rate and regular rhythm.   Pulmonary/Chest: Effort normal and breath sounds normal. No respiratory distress. She has no wheezes.  Musculoskeletal: Normal range of motion.  Lymphadenopathy:    She has no cervical adenopathy.  Neurological: She is alert and oriented to person, place, and time.  Skin: Skin is warm and dry.  Marked swelling of her lips and left ear, with redness and scaliness, involving the entrance to nose as well. Weeping serous sanguinous fluid, tender to palpation, no vesicular eruptions, 1 linear ulcer on frenulum, no dental abscess, no regional adenopathy  Early eczemoid changes R arm  Psychiatric: She has a normal mood and affect. Her behavior is normal.  Nursing note and vitals reviewed.   BP 128/62 mmHg  Pulse 80  Temp(Src) 98.1 F (36.7 C)  Resp 16  Ht 5' 5.5" (1.664 m)  Wt 124 lb (56.246 kg)  BMI 20.31 kg/m2  SpO2 98%     Assessment & Plan:  I have completed the patient encounter in its entirety as documented by the scribe, with editing by me where necessary. Yvone Slape P. Merla Richesoolittle, M.D.   Swelling of both lips  Allergic  eczema  Cellulitis of nose, external  Otitis, externa, infective, left  Meds ordered this encounter  Medications  . cephALEXin (KEFLEX) 500 MG capsule    Sig: Take 1 capsule (500 mg total) by mouth 3 (three) times daily.    Dispense:  21 capsule    Refill:  0  . predniSONE (DELTASONE) 20 MG tablet    Sig: 4/3/3/2/2/1/1 single daily dose for 7 days    Dispense:  16 tablet    Refill:  0  . hydrocortisone 2.5 % cream    Sig: Apply topically 2 (two) times daily.    Dispense:  30 g    Refill:  0   Reck 48h if not responding

## 2015-03-24 ENCOUNTER — Ambulatory Visit (INDEPENDENT_AMBULATORY_CARE_PROVIDER_SITE_OTHER): Payer: Medicare HMO | Admitting: Emergency Medicine

## 2015-03-24 VITALS — BP 106/60 | HR 80 | Temp 98.3°F | Resp 20 | Ht 65.0 in | Wt 122.8 lb

## 2015-03-24 DIAGNOSIS — L250 Unspecified contact dermatitis due to cosmetics: Secondary | ICD-10-CM

## 2015-03-24 DIAGNOSIS — L710 Perioral dermatitis: Secondary | ICD-10-CM | POA: Diagnosis not present

## 2015-03-24 DIAGNOSIS — T380X5A Adverse effect of glucocorticoids and synthetic analogues, initial encounter: Secondary | ICD-10-CM | POA: Diagnosis not present

## 2015-03-24 MED ORDER — DOXYCYCLINE HYCLATE 100 MG PO CAPS: 100.0000 mg | ORAL_CAPSULE | Freq: Two times a day (BID) | ORAL | Status: DC

## 2015-03-24 NOTE — Progress Notes (Signed)
Subjective:  Patient ID: Taylor Bolton, female    DOB: 1945/08/31  Age: 70 y.o. MRN: [id number]  CC: Rash   HPI  Taylor Bolton presents  patient was treated by Dr. Merla Richesoolittle for cellulitis of the nose on New Year's Day she also had a rash on around her lips. She was treated with steroid cream prednisone and an antibiotic. She now has a generalized rash on her lower face and neck and her anterior chest in sun exposed areas of the chest and also on her forearms. All rather pruritic. She has no new allergy exposure of the medications prescribed. She has no vesicular component to it is not painful.  History Taylor HesselbachMaria has a past medical history of Hypothyroidism; Rash and nonspecific skin eruption; Allergic rhinitis; Pelvic congestion syndrome; Insomnia; Vitamin D deficiency; Adrenal gland dysfunction (HCC); Food allergy; Fatigue; Intestinal candidiasis; History of intestinal parasite; Xerosis of skin; Postmenopausal; Fibrocystic breast disease; and Osteopenia.   She has past surgical history that includes Tonsillectomy and adenoidectomy (1950s).   Her  family history includes Breast cancer in her mother; Cancer in her father and mother; Prostate cancer in her father.  She   reports that she has never smoked. She has never used smokeless tobacco. She reports that she does not drink alcohol or use illicit drugs.  Outpatient Prescriptions Prior to Visit  Medication Sig Dispense Refill  . Estradiol-Estriol-Progesterone (BIEST/PROGESTERONE) CREA Place onto the skin.    Marland Kitchen. levothyroxine (SYNTHROID, LEVOTHROID) 88 MCG tablet Take 88 mcg by mouth daily before breakfast.    . progesterone (PROMETRIUM) 100 MG capsule Take 100 mg by mouth daily.    . hydrocortisone 2.5 % cream Apply topically 2 (two) times daily. 30 g 0  . cephALEXin (KEFLEX) 500 MG capsule Take 1 capsule (500 mg total) by mouth 3 (three) times daily. (Patient not taking: Reported on 03/24/2015) 21 capsule 0  . predniSONE (DELTASONE) 20 MG  tablet 4/3/3/2/2/1/1 single daily dose for 7 days (Patient not taking: Reported on 03/24/2015) 16 tablet 0   No facility-administered medications prior to visit.    Social History   Social History  . Marital Status: Married    Spouse Name: N/A  . Number of Children: 2  . Years of Education: N/A   Social History Main Topics  . Smoking status: Never Smoker   . Smokeless tobacco: Never Used  . Alcohol Use: No  . Drug Use: No  . Sexual Activity: Not Asked   Other Topics Concern  . None   Social History Narrative   Married, 1 son and 1 daughter.  1 grandson.   Retired Interior and spatial designerhairdresser.   [place]ocated from [place] 1990.   No T/A/Ds.   Exercise: nothing consistent     Review of Systems  Constitutional: Negative for fever, chills and appetite change.  HENT: Negative for congestion, ear pain, postnasal drip, sinus pressure and sore throat.   Eyes: Negative for pain and redness.  Respiratory: Negative for cough, shortness of breath and wheezing.   Cardiovascular: Negative for leg swelling.  Gastrointestinal: Negative for nausea, vomiting, abdominal pain, diarrhea, constipation and blood in stool.  Endocrine: Negative for polyuria.  Genitourinary: Negative for dysuria, urgency, frequency and flank pain.  Musculoskeletal: Negative for gait problem.  Skin: Positive for color change and rash.  Neurological: Negative for weakness and headaches.  Psychiatric/Behavioral: Negative for confusion and decreased concentration. The patient is not nervous/anxious.     Objective:  BP 106/60 mmHg  Pulse 80  Temp(Src) 98.3  F (36.8 C) (Oral)  Resp 20  Ht 5\' 5"  (1.651 m)  Wt 122 lb 12.8 oz (55.702 kg)  BMI 20.44 kg/m2  SpO2 98%  Physical Exam  Constitutional: She is oriented to person, place, and time. She appears well-developed and well-nourished.  HENT:  Head: Normocephalic and atraumatic.  Eyes: Conjunctivae are normal. Pupils are equal, round, and reactive to light.  Pulmonary/Chest: Effort  normal.  Musculoskeletal: She exhibits no edema.  Neurological: She is alert and oriented to person, place, and time.  Skin: Skin is dry. Rash noted.  Psychiatric: She has a normal mood and affect. Her behavior is normal. Thought content normal.      Assessment & Plan:   Elverda was seen today for rash.  Diagnoses and all orders for this visit:  Perioral dermatitis due to corticosteroid -     Ambulatory referral to Dermatology  Contact dermatitis due to cosmetics, unspecified contact dermatitis type  Other orders -     doxycycline (VIBRAMYCIN) 100 MG capsule; Take 1 capsule (100 mg total) by mouth 2 (two) times daily.   I have discontinued Ms. Shearer's cephALEXin, predniSONE, and hydrocortisone. I am also having her start on doxycycline. Additionally, I am having her maintain her levothyroxine, progesterone, and BIEST/PROGESTERONE.  Meds ordered this encounter  Medications  . doxycycline (VIBRAMYCIN) 100 MG capsule    Sig: Take 1 capsule (100 mg total) by mouth 2 (two) times daily.    Dispense:  20 capsule    Refill:  0    Appropriate red flag conditions were discussed with the patient as well as actions that should be taken.  Patient expressed his understanding.  Follow-up: Return if symptoms worsen or fail to improve.  Carmelina Dane, MD

## 2015-07-01 ENCOUNTER — Encounter: Payer: Medicare HMO | Admitting: Family Medicine

## 2015-07-01 DIAGNOSIS — E612 Magnesium deficiency: Secondary | ICD-10-CM | POA: Diagnosis not present

## 2015-07-01 DIAGNOSIS — D509 Iron deficiency anemia, unspecified: Secondary | ICD-10-CM | POA: Diagnosis not present

## 2015-07-01 DIAGNOSIS — E039 Hypothyroidism, unspecified: Secondary | ICD-10-CM | POA: Diagnosis not present

## 2015-07-01 DIAGNOSIS — N951 Menopausal and female climacteric states: Secondary | ICD-10-CM | POA: Diagnosis not present

## 2015-07-01 DIAGNOSIS — N9489 Other specified conditions associated with female genital organs and menstrual cycle: Secondary | ICD-10-CM | POA: Diagnosis not present

## 2015-07-12 DIAGNOSIS — R799 Abnormal finding of blood chemistry, unspecified: Secondary | ICD-10-CM | POA: Diagnosis not present

## 2015-07-12 DIAGNOSIS — E279 Disorder of adrenal gland, unspecified: Secondary | ICD-10-CM | POA: Diagnosis not present

## 2015-07-12 DIAGNOSIS — N951 Menopausal and female climacteric states: Secondary | ICD-10-CM | POA: Diagnosis not present

## 2015-07-18 ENCOUNTER — Encounter: Payer: Medicare HMO | Admitting: Family Medicine

## 2015-08-26 ENCOUNTER — Ambulatory Visit (INDEPENDENT_AMBULATORY_CARE_PROVIDER_SITE_OTHER): Payer: Medicare HMO | Admitting: Family Medicine

## 2015-08-26 ENCOUNTER — Encounter: Payer: Self-pay | Admitting: Family Medicine

## 2015-08-26 VITALS — BP 104/68 | HR 70 | Temp 98.1°F | Resp 16 | Ht 65.0 in | Wt 127.5 lb

## 2015-08-26 DIAGNOSIS — K115 Sialolithiasis: Secondary | ICD-10-CM

## 2015-08-26 DIAGNOSIS — K1120 Sialoadenitis, unspecified: Secondary | ICD-10-CM | POA: Diagnosis not present

## 2015-08-26 NOTE — Progress Notes (Signed)
OFFICE VISIT  08/26/2015   CC:  Chief Complaint  Patient presents with  . Jaw Pain    left side x 2-3 days   HPI:    Patient is a 70 y.o. Caucasian female who presents for 2d L jaw pain, extends under ear and behind angle of jaw. Hurts worse with chewing and after chewing.  Notes swelling on L mandibular region.  ? Salivating making it worse. It is definitely worse with sour foods.  No fever or malaise.  Past Medical History  Diagnosis Date  . Hypothyroidism   . Rash and nonspecific skin eruption     Initially treated as allergic dermatitis but didn't help; then her integrative med MD dx'd it as some sort of yeast infection and it cleared up with diflucan  . Allergic rhinitis     Perennial; with PND; treats with "natural" supplements  . Pelvic congestion syndrome   . Insomnia   . Vitamin D deficiency   . Adrenal gland dysfunction (HCC)   . Food allergy     +sensitivities (almonds)  . Fatigue     ? fibromyalgia per old records  . Intestinal candidiasis   . History of intestinal parasite     giardia and roundworms  . Xerosis of skin   . Postmenopausal   . Fibrocystic breast disease   . Osteopenia     Past Surgical History  Procedure Laterality Date  . Tonsillectomy and adenoidectomy  1950s    Outpatient Prescriptions Prior to Visit  Medication Sig Dispense Refill  . Estradiol-Estriol-Progesterone (BIEST/PROGESTERONE) CREA Place onto the skin.    Marland Kitchen. levothyroxine (SYNTHROID, LEVOTHROID) 88 MCG tablet Take 88 mcg by mouth daily before breakfast.    . progesterone (PROMETRIUM) 100 MG capsule Take 100 mg by mouth daily.    Marland Kitchen. doxycycline (VIBRAMYCIN) 100 MG capsule Take 1 capsule (100 mg total) by mouth 2 (two) times daily. (Patient not taking: Reported on 08/26/2015) 20 capsule 0   No facility-administered medications prior to visit.    Allergies  Allergen Reactions  . Fluconazole Other (See Comments)    unspecified  . Influenza Vaccines Other (See Comments)   unspecified  . Sporanox [Itraconazole] Other (See Comments)    unspecified  . Sudafed [Pseudoephedrine Hcl]     ROS As per HPI  PE: Blood pressure 104/68, pulse 70, temperature 98.1 F (36.7 C), temperature source Oral, resp. rate 16, height 5\' 5"  (1.651 m), weight 127 lb 8 oz (57.834 kg), SpO2 98 %. Gen: Alert, well appearing.  Patient is oriented to person, place, time, and situation. ENT: Ears: EACs clear, normal epithelium.  TMs with good light reflex and landmarks bilaterally.  Eyes: no injection, icteris, swelling, or exudate.  EOMI, PERRLA. Nose: no drainage or turbinate edema/swelling.  No injection or focal lesion.  Mouth: lips without lesion/swelling.  Oral mucosa pink and moist.  Dentition intact and without obvious caries or gingival swelling.  Oropharynx without erythema, exudate, or swelling.  R jaw w/out tenderness or swelling. L jaw with mild TTP at inferior aspect of parotid gland diffusely, extending a bit under the angle of the mandible. No tenderness of submandibular glands.  No pre-auricular, post-auricular, or cervical LAD.  LABS:  Lab Results  Component Value Date   TSH 5.404* 01/02/2013   IMPRESSION AND PLAN:  Left parotid gland inflammation: suspect sialolithiasis. Instructions: keep well hydrated, apply moist heat to the involved area, massage the gland, "milk" the duct, and suck on tart hard candies to promote salivary flow.  Pain should be managed with nonsteroidal antiinflammatory drugs (NSAIDs).  An After Visit Summary was printed and given to the patient.  FOLLOW UP: Return for call or return if not remarkably improved in 2-3 more days.  Signed:  Santiago Bumpers, MD           08/26/2015

## 2015-08-26 NOTE — Patient Instructions (Signed)
keep well hydrated, apply moist heat to the involved area, massage the gland, "milk" the duct, and suck on tart hard candies to promote salivary flow. Pain should be managed with nonsteroidal antiinflammatory drugs (NSAIDs)

## 2015-08-26 NOTE — Progress Notes (Signed)
Pre visit review using our clinic review tool, if applicable. No additional management support is needed unless otherwise documented below in the visit note. 

## 2015-09-02 DIAGNOSIS — N951 Menopausal and female climacteric states: Secondary | ICD-10-CM | POA: Diagnosis not present

## 2015-09-11 DIAGNOSIS — R69 Illness, unspecified: Secondary | ICD-10-CM | POA: Diagnosis not present

## 2016-01-13 DIAGNOSIS — E871 Hypo-osmolality and hyponatremia: Secondary | ICD-10-CM | POA: Diagnosis not present

## 2016-01-13 DIAGNOSIS — E279 Disorder of adrenal gland, unspecified: Secondary | ICD-10-CM | POA: Diagnosis not present

## 2016-01-13 DIAGNOSIS — N951 Menopausal and female climacteric states: Secondary | ICD-10-CM | POA: Diagnosis not present

## 2016-02-04 DIAGNOSIS — N951 Menopausal and female climacteric states: Secondary | ICD-10-CM | POA: Diagnosis not present

## 2016-03-30 DIAGNOSIS — J111 Influenza due to unidentified influenza virus with other respiratory manifestations: Secondary | ICD-10-CM | POA: Diagnosis not present

## 2016-03-30 DIAGNOSIS — J3489 Other specified disorders of nose and nasal sinuses: Secondary | ICD-10-CM | POA: Diagnosis not present

## 2016-03-30 DIAGNOSIS — J069 Acute upper respiratory infection, unspecified: Secondary | ICD-10-CM | POA: Diagnosis not present

## 2016-03-30 DIAGNOSIS — R509 Fever, unspecified: Secondary | ICD-10-CM | POA: Diagnosis not present

## 2016-05-07 DIAGNOSIS — R69 Illness, unspecified: Secondary | ICD-10-CM | POA: Diagnosis not present

## 2016-07-31 DIAGNOSIS — I708 Atherosclerosis of other arteries: Secondary | ICD-10-CM | POA: Diagnosis not present

## 2016-07-31 DIAGNOSIS — K521 Toxic gastroenteritis and colitis: Secondary | ICD-10-CM | POA: Diagnosis not present

## 2016-07-31 DIAGNOSIS — E063 Autoimmune thyroiditis: Secondary | ICD-10-CM | POA: Diagnosis not present

## 2016-07-31 DIAGNOSIS — R5383 Other fatigue: Secondary | ICD-10-CM | POA: Diagnosis not present

## 2016-07-31 DIAGNOSIS — K909 Intestinal malabsorption, unspecified: Secondary | ICD-10-CM | POA: Diagnosis not present

## 2016-07-31 DIAGNOSIS — E279 Disorder of adrenal gland, unspecified: Secondary | ICD-10-CM | POA: Diagnosis not present

## 2016-07-31 DIAGNOSIS — E038 Other specified hypothyroidism: Secondary | ICD-10-CM | POA: Diagnosis not present

## 2016-07-31 DIAGNOSIS — E7211 Homocystinuria: Secondary | ICD-10-CM | POA: Diagnosis not present

## 2016-07-31 DIAGNOSIS — N951 Menopausal and female climacteric states: Secondary | ICD-10-CM | POA: Diagnosis not present

## 2016-09-04 ENCOUNTER — Telehealth: Payer: Self-pay

## 2016-09-04 NOTE — Telephone Encounter (Signed)
Patient is on the list for [employer] [facility] and may be a good candidate for an AWV. Please let me know if/when appt is scheduled.   

## 2016-10-07 DIAGNOSIS — N950 Postmenopausal bleeding: Secondary | ICD-10-CM

## 2016-10-07 HISTORY — DX: Postmenopausal bleeding: N95.0

## 2016-10-08 ENCOUNTER — Other Ambulatory Visit: Payer: Self-pay | Admitting: Family Medicine

## 2016-10-08 DIAGNOSIS — N939 Abnormal uterine and vaginal bleeding, unspecified: Secondary | ICD-10-CM

## 2016-10-08 DIAGNOSIS — N951 Menopausal and female climacteric states: Secondary | ICD-10-CM

## 2016-10-09 ENCOUNTER — Other Ambulatory Visit: Payer: Self-pay | Admitting: Family Medicine

## 2016-10-09 DIAGNOSIS — N951 Menopausal and female climacteric states: Secondary | ICD-10-CM

## 2016-10-09 DIAGNOSIS — N939 Abnormal uterine and vaginal bleeding, unspecified: Secondary | ICD-10-CM

## 2016-10-14 ENCOUNTER — Ambulatory Visit
Admission: RE | Admit: 2016-10-14 | Discharge: 2016-10-14 | Disposition: A | Discharge status: Home / Self Care | Payer: Medicare HMO | Source: Ambulatory Visit | Attending: Family Medicine | Admitting: Family Medicine

## 2016-10-14 DIAGNOSIS — N939 Abnormal uterine and vaginal bleeding, unspecified: Secondary | ICD-10-CM

## 2016-10-14 DIAGNOSIS — N951 Menopausal and female climacteric states: Secondary | ICD-10-CM

## 2016-10-23 ENCOUNTER — Ambulatory Visit: Payer: Medicare HMO | Admitting: Obstetrics & Gynecology

## 2016-11-02 ENCOUNTER — Ambulatory Visit (INDEPENDENT_AMBULATORY_CARE_PROVIDER_SITE_OTHER): Payer: Medicare HMO | Admitting: Obstetrics & Gynecology

## 2016-11-02 ENCOUNTER — Encounter: Payer: Self-pay | Admitting: Obstetrics & Gynecology

## 2016-11-02 VITALS — BP 112/62 | Ht 64.75 in | Wt 124.0 lb

## 2016-11-02 DIAGNOSIS — Z01411 Encounter for gynecological examination (general) (routine) with abnormal findings: Secondary | ICD-10-CM | POA: Diagnosis not present

## 2016-11-02 DIAGNOSIS — Z79890 Hormone replacement therapy (postmenopausal): Secondary | ICD-10-CM

## 2016-11-02 DIAGNOSIS — Z124 Encounter for screening for malignant neoplasm of cervix: Secondary | ICD-10-CM

## 2016-11-02 DIAGNOSIS — R87610 Atypical squamous cells of undetermined significance on cytologic smear of cervix (ASC-US): Secondary | ICD-10-CM | POA: Diagnosis not present

## 2016-11-02 DIAGNOSIS — R9389 Abnormal findings on diagnostic imaging of other specified body structures: Secondary | ICD-10-CM

## 2016-11-02 DIAGNOSIS — Z01419 Encounter for gynecological examination (general) (routine) without abnormal findings: Secondary | ICD-10-CM | POA: Diagnosis not present

## 2016-11-02 DIAGNOSIS — N950 Postmenopausal bleeding: Secondary | ICD-10-CM

## 2016-11-02 DIAGNOSIS — R938 Abnormal findings on diagnostic imaging of other specified body structures: Secondary | ICD-10-CM

## 2016-11-02 DIAGNOSIS — R87619 Unspecified abnormal cytological findings in specimens from cervix uteri: Secondary | ICD-10-CM

## 2016-11-02 DIAGNOSIS — N858 Other specified noninflammatory disorders of uterus: Secondary | ICD-10-CM | POA: Diagnosis not present

## 2016-11-02 HISTORY — DX: Unspecified abnormal cytological findings in specimens from cervix uteri: R87.619

## 2016-11-02 HISTORY — PX: ENDOMETRIAL BIOPSY: SHX622

## 2016-11-02 NOTE — Progress Notes (Signed)
Taylor Bolton February 24, 1946 [id number]   History:    71 y.o. G79P2A1 Married  RP:  New patient referred for PMB and due for annual gyn exam   HPI:  On Bioidentical Progesterone gel and Vaginal Estrogen/Testo cream x 10 years.  Had a menstrual like PMB episode x 1 week about 6 months ago and then again 3 months ago.  Had mild/moderate vaginal bleeding with cramping and breast tenderness both times.  More recently, 2 weeks ago, had vaginal spotting x 2 days.  No pelvic pain.  Started weaning her Vaginal cream of Estrogen/Testo 1 week ago.  Sexually active.  Normal vaginal secretions otherwise.  No Fam H/O of Ovarian Ca or Colon Ca.  Mother deceased of Breast Ca.  Father has Prostate Ca.  Past medical history,surgical history, family history and social history were all reviewed and documented in the EPIC chart.  Gynecologic History No LMP recorded. Patient is postmenopausal. Contraception: post menopausal status Last Pap: 03/2014. Results were: normal Last mammogram: 03/2014. Results were: normal  Obstetric History OB History  Gravida Para Term Preterm AB Living  3 2     1 2   SAB TAB Ectopic Multiple Live Births  1            # Outcome Date GA Lbr Len/2nd Weight Sex Delivery Anes PTL Lv  3 SAB           2 Para           1 Para                ROS: A ROS was performed and pertinent positives and negatives are included in the history.  GENERAL: No fevers or chills. HEENT: No change in vision, no earache, sore throat or sinus congestion. NECK: No pain or stiffness. CARDIOVASCULAR: No chest pain or pressure. No palpitations. PULMONARY: No shortness of breath, cough or wheeze. GASTROINTESTINAL: No abdominal pain, nausea, vomiting or diarrhea, melena or bright red blood per rectum. GENITOURINARY: No urinary frequency, urgency, hesitancy or dysuria. MUSCULOSKELETAL: No joint or muscle pain, no back pain, no recent trauma. DERMATOLOGIC: No rash, no itching, no lesions. ENDOCRINE: No polyuria,  polydipsia, no heat or cold intolerance. No recent change in weight. HEMATOLOGICAL: No anemia or easy bruising or bleeding. NEUROLOGIC: No headache, seizures, numbness, tingling or weakness. PSYCHIATRIC: No depression, no loss of interest in normal activity or change in sleep pattern.     Exam:   BP 112/62   Ht 5' 4.75" (1.645 m)   Wt 124 lb (56.2 kg)   BMI 20.79 kg/m   Body mass index is 20.79 kg/m.  General appearance : Well developed well nourished female. No acute distress HEENT: Eyes: no retinal hemorrhage or exudates,  Neck supple, trachea midline, no carotid bruits, no thyroidmegaly Lungs: Clear to auscultation, no rhonchi or wheezes, or rib retractions  Heart: Regular rate and rhythm, no murmurs or gallops Breast:Examined in sitting and supine position were symmetrical in appearance, no palpable masses or tenderness,  no skin retraction, no nipple inversion, no nipple discharge, no skin discoloration, no axillary or supraclavicular lymphadenopathy Abdomen: no palpable masses or tenderness, no rebound or guarding Extremities: no edema or skin discoloration or tenderness  Pelvic: Vulva normal  Bartholin, Urethra, Skene Glands: Within normal limits             Vagina: No gross lesions or discharge  Cervix: No gross lesions or discharge.  Pap done.  Uterus  AV, normal size, shape and  consistency, non-tender and mobile  Adnexa  Without masses or tenderness  Anus and perineum  normal    Pelvic US 10/14/2016:  Uterus Measurements: 7.4 x 3.6 x 4.3 cm. Normal morphology without mass.  Endometrium Thickness: 6 mm thick.  No endometrial fluid or focal abnormality Right ovary Not visualized on either transabdominal or endovaginal imaging suspect obscured by bowel.  Left ovary Not visualized on either transabdominal or endovaginal imaging suspect obscured by bowel.  Other findings No free pelvic fluid or adnexal masses.  IMPRESSION: Abnormal mild thickening of the endometrial complex for a  postmenopausal patient at 6 mm thick. In the setting of post-menopausal bleeding, endometrial sampling is indicated to exclude carcinoma. If results are benign, sonohysterogram should be considered for focal lesion work-up.  EBX done today:  Betadine prep.  Hurricane spray on cervix.  Tenaculum on anterior lip of cervix.  Easy insertion of EBx canula in IU cavity.  Mild specimen obtained.  Sent to pathology.  Instruments removed.  Advil given.   Assessment/Plan:  71 y.o. female for annual  1. Postmenopausal bleeding R/O Endometrial Hyperplasia/Endometrial Ca and Intra-Uterine lesions like Polyps or Fibroids.  Endometrial Biopsy successfully done today, pending results.  F/U Sonohysto if benign.  Counseling done on PMB.  Could be due to HRT.  Will wean and stop HRT within a month. - [place] Sonohysterogram; Future  2. Thickened endometrium Per [place] 10/14/2016, Endometrial line at 6 mm.  Endometrial Biopsy done today, will complete investigation with a Sonohystero at f/u.  Possible slightly thickened because of HRT.  3. Post-menopause on HRT (hormone replacement therapy) On HRT x 10 years.  Risks/Benefits reviewed.  Patient agrees to wean and stop as the risks exceed the benefits.  Will wean and stop within a month.  Recommend Replens moisturizer as needed and Astroglide lubricant for Intercourse.  4. Encounter for routine gynecological examination with Papanicolaou smear of cervix Gyn exam normal.  Pap reflex done.  Breasts wnl.  Needs to schedule Screening Mammo asap.  Mother with h/o Breast Cancer.  Counseling on above issues >50% x 30 minutes.  Genia Del MD, 10:36 AM 11/02/2016

## 2016-11-02 NOTE — Patient Instructions (Signed)
1. Postmenopausal bleeding R/O Endometrial Hyperplasia/Endometrial Ca and Intra-Uterine lesions like Polyps or Fibroids.  Endometrial Biopsy successfully done today, pending results.  F/U Sonohysto if benign.  Counseling done on PMB.  Could be due to HRT.  Will wean and stop HRT within a month. - [place] Sonohysterogram; Future  2. Thickened endometrium Per [place] 10/14/2016, Endometrial line at 6 mm.  Endometrial Biopsy done today, will complete investigation with a Sonohystero at f/u.  Possible slightly thickened because of HRT.  3. Post-menopause on HRT (hormone replacement therapy) On HRT x 10 years.  Risks/Benefits reviewed.  Patient agrees to wean and stop as the risks exceed the benefits.  Will wean and stop within a month.  Recommend Replens moisturizer as needed and Astroglide lubricant for Intercourse.  4. Encounter for routine gynecological examination with Papanicolaou smear of cervix Gyn exam normal.  Pap reflex done.  Breasts wnl.  Needs to schedule Screening Mammo asap.  Mother with h/o Breast Cancer.  Taylor Bolton, it was a pleasure to meet you today!  I will inform you of your results as soon as available.  Postmenopausal Bleeding Postmenopausal bleeding is any bleeding a woman has after she has entered into menopause. Menopause is the end of a woman's fertile years. After menopause, a woman no longer ovulates or has menstrual periods. Postmenopausal bleeding can be caused by various things. Any type of postmenopausal bleeding, even if it appears to be a typical menstrual period, is concerning. This should be evaluated by your health care provider. Any treatment will depend on the cause of the bleeding. Follow these instructions at home: Monitor your condition for any changes. The following actions may help to alleviate any discomfort you are experiencing:  Avoid the use of tampons and douches as directed by your health care provider.  Change your pads frequently.  Get regular pelvic exams  and Pap tests.  Keep all follow-up appointments for diagnostic tests as directed by your health care provider.  Contact a health care provider if:  Your bleeding lasts more than 1 week.  You have abdominal pain.  You have bleeding with sexual intercourse. Get help right away if:  You have a fever, chills, headache, dizziness, muscle aches, and bleeding.  You have severe pain with bleeding.  You are passing blood clots.  You have bleeding and need more than 1 pad an hour.  You feel faint. This information is not intended to replace advice given to you by your health care provider. Make sure you discuss any questions you have with your health care provider. Document Released: 06/03/2005 Document Revised: 08/01/2015 Document Reviewed: 09/22/2012 Elsevier Interactive Patient Education  [employer].

## 2016-11-03 LAB — PAP IG W/ RFLX HPV ASCU

## 2016-11-04 LAB — HUMAN PAPILLOMAVIRUS, HIGH RISK: HPV DNA High Risk: NOT DETECTED

## 2016-11-11 ENCOUNTER — Other Ambulatory Visit: Payer: Self-pay | Admitting: Obstetrics & Gynecology

## 2016-11-11 DIAGNOSIS — R9389 Abnormal findings on diagnostic imaging of other specified body structures: Secondary | ICD-10-CM

## 2016-11-25 ENCOUNTER — Other Ambulatory Visit: Payer: Medicare HMO

## 2016-11-25 ENCOUNTER — Ambulatory Visit: Payer: Medicare HMO | Admitting: Obstetrics & Gynecology

## 2017-01-18 ENCOUNTER — Encounter: Payer: Self-pay | Admitting: Family Medicine

## 2017-06-10 DIAGNOSIS — R69 Illness, unspecified: Secondary | ICD-10-CM | POA: Diagnosis not present

## 2017-06-17 DIAGNOSIS — R69 Illness, unspecified: Secondary | ICD-10-CM | POA: Diagnosis not present

## 2017-06-17 DIAGNOSIS — B3782 Candidal enteritis: Secondary | ICD-10-CM | POA: Diagnosis not present

## 2017-06-17 DIAGNOSIS — E7212 Methylenetetrahydrofolate reductase deficiency: Secondary | ICD-10-CM | POA: Diagnosis not present

## 2017-06-17 DIAGNOSIS — B3789 Other sites of candidiasis: Secondary | ICD-10-CM | POA: Diagnosis not present

## 2017-06-17 DIAGNOSIS — N939 Abnormal uterine and vaginal bleeding, unspecified: Secondary | ICD-10-CM | POA: Diagnosis not present

## 2017-06-17 DIAGNOSIS — E639 Nutritional deficiency, unspecified: Secondary | ICD-10-CM | POA: Diagnosis not present

## 2017-06-17 DIAGNOSIS — E7211 Homocystinuria: Secondary | ICD-10-CM | POA: Diagnosis not present

## 2017-06-17 DIAGNOSIS — E039 Hypothyroidism, unspecified: Secondary | ICD-10-CM | POA: Diagnosis not present

## 2017-06-17 DIAGNOSIS — K909 Intestinal malabsorption, unspecified: Secondary | ICD-10-CM | POA: Diagnosis not present

## 2017-06-17 DIAGNOSIS — N951 Menopausal and female climacteric states: Secondary | ICD-10-CM | POA: Diagnosis not present

## 2017-06-30 DIAGNOSIS — H524 Presbyopia: Secondary | ICD-10-CM | POA: Diagnosis not present

## 2017-12-06 DIAGNOSIS — B3782 Candidal enteritis: Secondary | ICD-10-CM | POA: Diagnosis not present

## 2017-12-06 DIAGNOSIS — K909 Intestinal malabsorption, unspecified: Secondary | ICD-10-CM | POA: Diagnosis not present

## 2017-12-06 DIAGNOSIS — E7212 Methylenetetrahydrofolate reductase deficiency: Secondary | ICD-10-CM | POA: Diagnosis not present

## 2017-12-06 DIAGNOSIS — N951 Menopausal and female climacteric states: Secondary | ICD-10-CM | POA: Diagnosis not present

## 2017-12-06 DIAGNOSIS — N939 Abnormal uterine and vaginal bleeding, unspecified: Secondary | ICD-10-CM | POA: Diagnosis not present

## 2017-12-06 DIAGNOSIS — K521 Toxic gastroenteritis and colitis: Secondary | ICD-10-CM | POA: Diagnosis not present

## 2017-12-06 DIAGNOSIS — E559 Vitamin D deficiency, unspecified: Secondary | ICD-10-CM | POA: Diagnosis not present

## 2017-12-06 DIAGNOSIS — E639 Nutritional deficiency, unspecified: Secondary | ICD-10-CM | POA: Diagnosis not present

## 2017-12-06 DIAGNOSIS — A0472 Enterocolitis due to Clostridium difficile, not specified as recurrent: Secondary | ICD-10-CM | POA: Diagnosis not present

## 2017-12-06 DIAGNOSIS — E7211 Homocystinuria: Secondary | ICD-10-CM | POA: Diagnosis not present

## 2017-12-06 DIAGNOSIS — R69 Illness, unspecified: Secondary | ICD-10-CM | POA: Diagnosis not present

## 2017-12-16 DIAGNOSIS — R69 Illness, unspecified: Secondary | ICD-10-CM | POA: Diagnosis not present

## 2017-12-31 DIAGNOSIS — R69 Illness, unspecified: Secondary | ICD-10-CM | POA: Diagnosis not present

## 2017-12-31 DIAGNOSIS — E7212 Methylenetetrahydrofolate reductase deficiency: Secondary | ICD-10-CM | POA: Diagnosis not present

## 2017-12-31 DIAGNOSIS — N951 Menopausal and female climacteric states: Secondary | ICD-10-CM | POA: Diagnosis not present

## 2017-12-31 DIAGNOSIS — B3782 Candidal enteritis: Secondary | ICD-10-CM | POA: Diagnosis not present

## 2017-12-31 DIAGNOSIS — E7211 Homocystinuria: Secondary | ICD-10-CM | POA: Diagnosis not present

## 2017-12-31 DIAGNOSIS — K909 Intestinal malabsorption, unspecified: Secondary | ICD-10-CM | POA: Diagnosis not present

## 2017-12-31 DIAGNOSIS — E063 Autoimmune thyroiditis: Secondary | ICD-10-CM | POA: Diagnosis not present

## 2017-12-31 DIAGNOSIS — K521 Toxic gastroenteritis and colitis: Secondary | ICD-10-CM | POA: Diagnosis not present

## 2017-12-31 DIAGNOSIS — N939 Abnormal uterine and vaginal bleeding, unspecified: Secondary | ICD-10-CM | POA: Diagnosis not present

## 2017-12-31 DIAGNOSIS — E639 Nutritional deficiency, unspecified: Secondary | ICD-10-CM | POA: Diagnosis not present

## 2018-01-31 DIAGNOSIS — R69 Illness, unspecified: Secondary | ICD-10-CM | POA: Diagnosis not present

## 2018-06-30 DIAGNOSIS — K521 Toxic gastroenteritis and colitis: Secondary | ICD-10-CM | POA: Diagnosis not present

## 2018-06-30 DIAGNOSIS — E782 Mixed hyperlipidemia: Secondary | ICD-10-CM | POA: Diagnosis not present

## 2018-06-30 DIAGNOSIS — E7212 Methylenetetrahydrofolate reductase deficiency: Secondary | ICD-10-CM | POA: Diagnosis not present

## 2018-06-30 DIAGNOSIS — E7211 Homocystinuria: Secondary | ICD-10-CM | POA: Diagnosis not present

## 2018-06-30 DIAGNOSIS — R69 Illness, unspecified: Secondary | ICD-10-CM | POA: Diagnosis not present

## 2018-06-30 DIAGNOSIS — N951 Menopausal and female climacteric states: Secondary | ICD-10-CM | POA: Diagnosis not present

## 2018-06-30 DIAGNOSIS — E559 Vitamin D deficiency, unspecified: Secondary | ICD-10-CM | POA: Diagnosis not present

## 2018-06-30 DIAGNOSIS — K909 Intestinal malabsorption, unspecified: Secondary | ICD-10-CM | POA: Diagnosis not present

## 2018-06-30 DIAGNOSIS — E063 Autoimmune thyroiditis: Secondary | ICD-10-CM | POA: Diagnosis not present

## 2018-06-30 DIAGNOSIS — N939 Abnormal uterine and vaginal bleeding, unspecified: Secondary | ICD-10-CM | POA: Diagnosis not present

## 2018-06-30 DIAGNOSIS — E639 Nutritional deficiency, unspecified: Secondary | ICD-10-CM | POA: Diagnosis not present

## 2018-06-30 DIAGNOSIS — B3782 Candidal enteritis: Secondary | ICD-10-CM | POA: Diagnosis not present

## 2018-08-05 DIAGNOSIS — H5213 Myopia, bilateral: Secondary | ICD-10-CM | POA: Diagnosis not present

## 2018-08-09 DIAGNOSIS — H5213 Myopia, bilateral: Secondary | ICD-10-CM | POA: Diagnosis not present

## 2018-08-09 DIAGNOSIS — H52209 Unspecified astigmatism, unspecified eye: Secondary | ICD-10-CM | POA: Diagnosis not present

## 2018-08-09 DIAGNOSIS — H524 Presbyopia: Secondary | ICD-10-CM | POA: Diagnosis not present

## 2018-09-02 DIAGNOSIS — R69 Illness, unspecified: Secondary | ICD-10-CM | POA: Diagnosis not present

## 2018-09-06 DIAGNOSIS — R69 Illness, unspecified: Secondary | ICD-10-CM | POA: Diagnosis not present

## 2018-09-15 DIAGNOSIS — R69 Illness, unspecified: Secondary | ICD-10-CM | POA: Diagnosis not present

## 2018-12-07 IMAGING — US US PELVIS COMPLETE
1 series · 13 of 25 positions shown · non-contrast
Comparison: 11/07/2004

CLINICAL DATA: Symptomatic menopausal or female climacteric state,
abnormal uterine and vaginal bleeding, postmenopausal bleeding
years

EXAM:
TRANSABDOMINAL AND TRANSVAGINAL ULTRASOUND OF PELVIS
TECHNIQUE: Both transabdominal and transvaginal ultrasound examinations of the
pelvis were performed. Transabdominal technique was performed for
global imaging of the pelvis including uterus, ovaries, adnexal
regions, and pelvic cul-de-sac. It was necessary to proceed with
endovaginal exam following the transabdominal exam to visualize the
endometrium and ovaries.

[Series 1: us pelvis complete · 0.23mm/px · 13 of 37 slices shown]
[im 1/37]
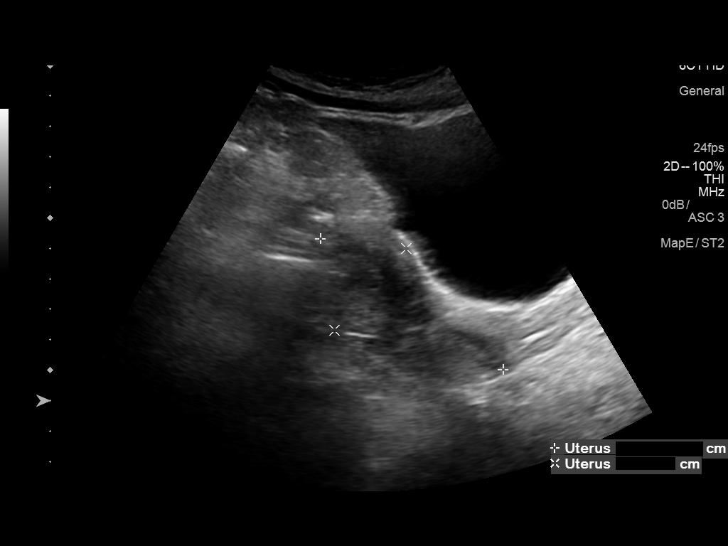
[im 4/37]
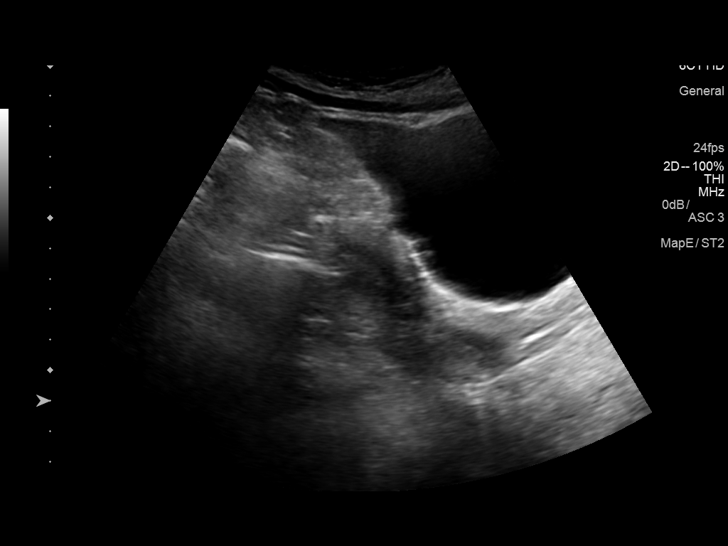
[im 7/37]
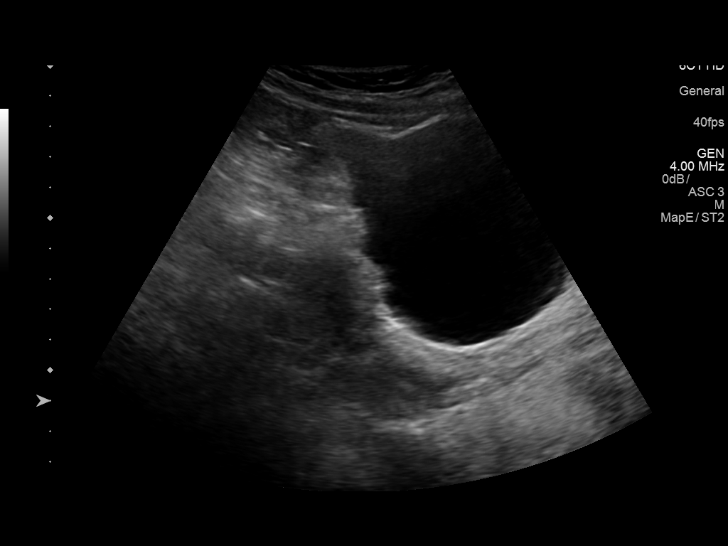
[im 10/37]
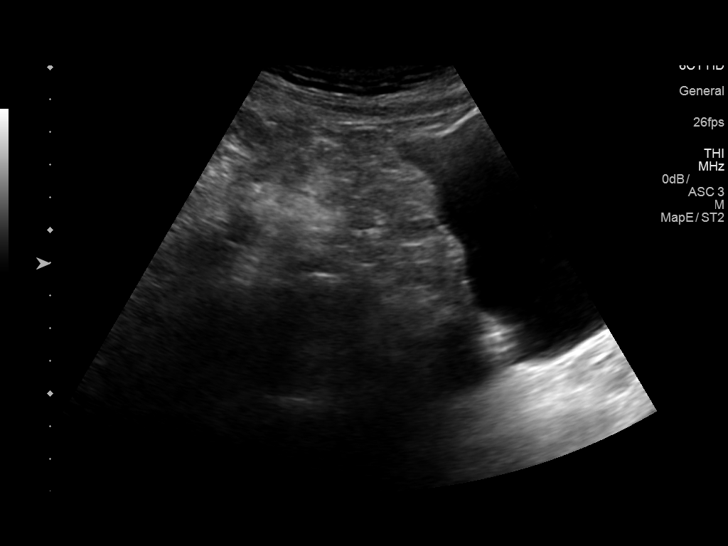
[im 13/37]
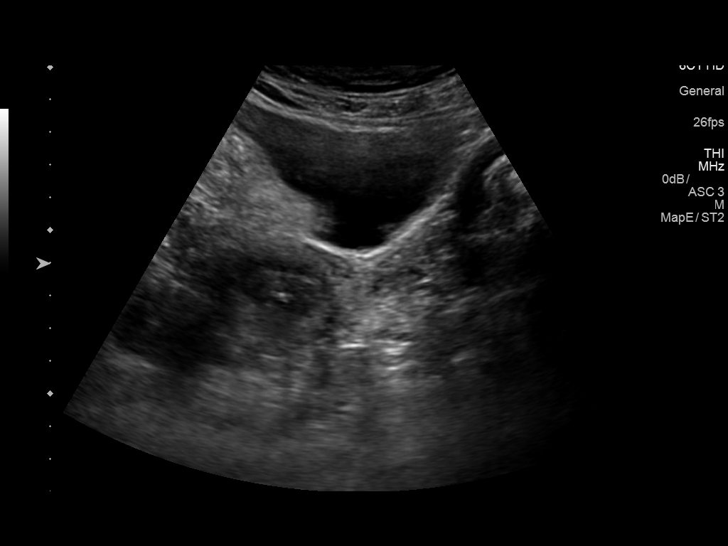
[im 16/37]
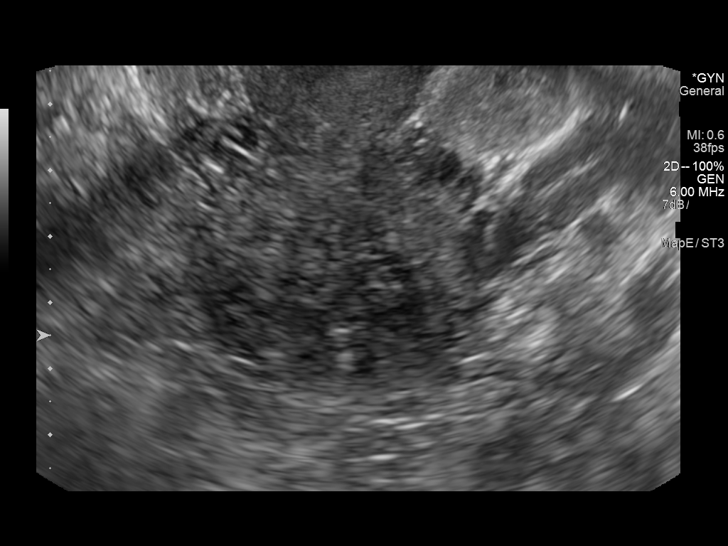
[im 19/37]
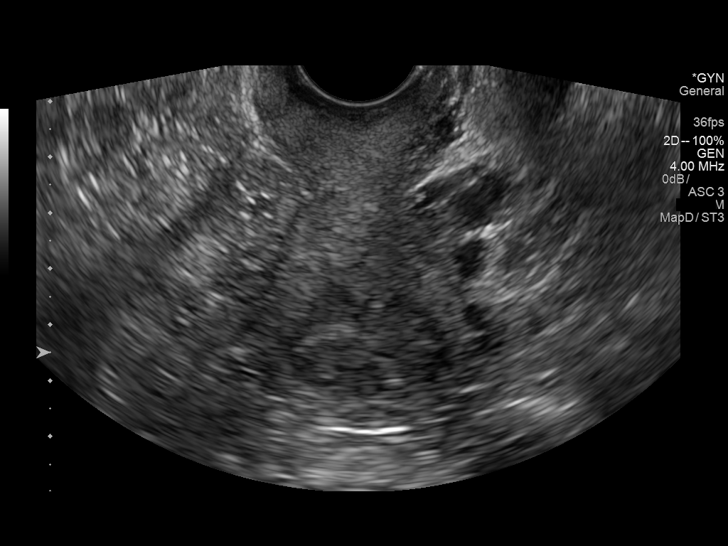
[im 22/37]
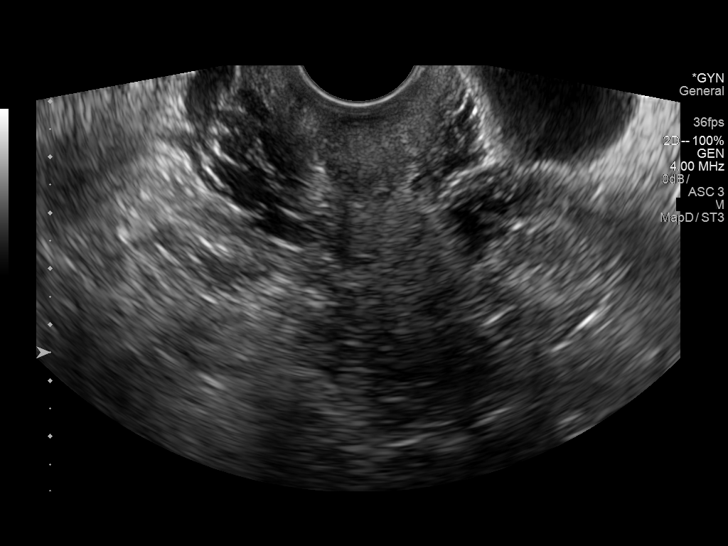
[im 25/37]
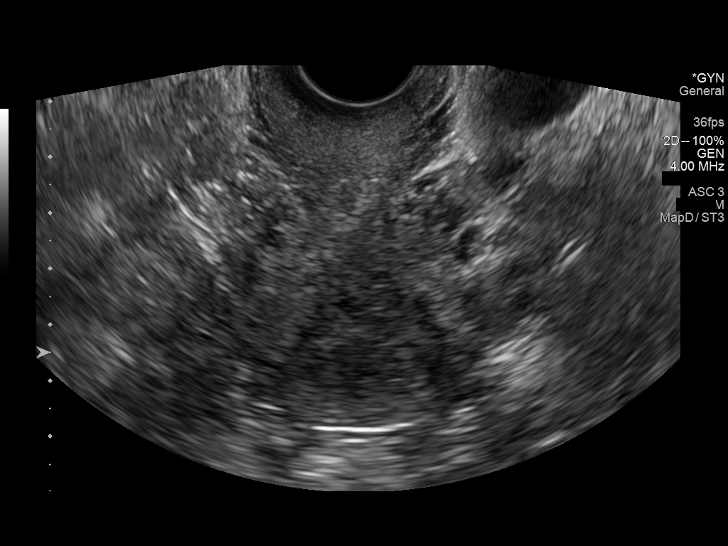
[im 28/37]
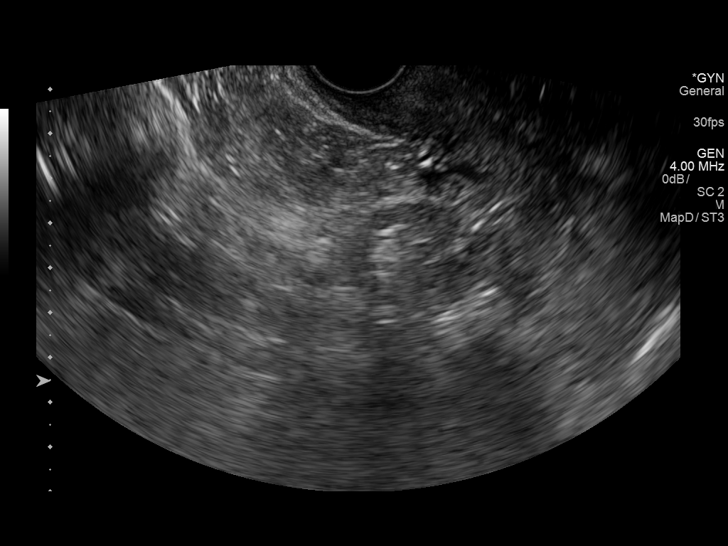
[im 31/37]
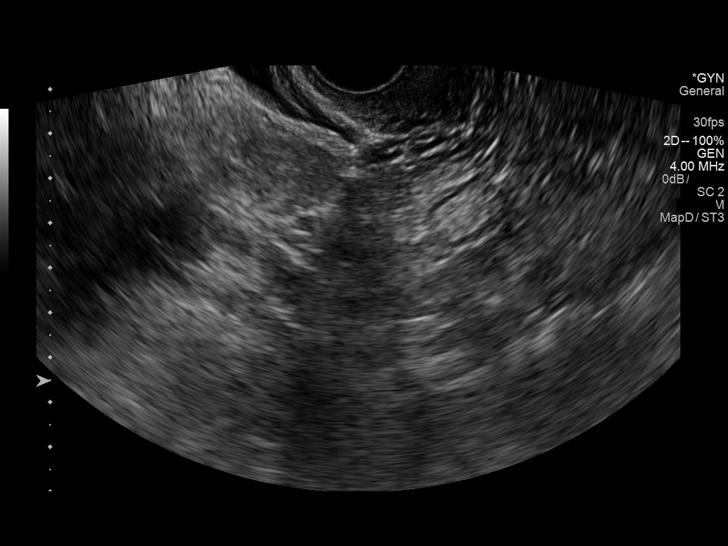
[im 34/37]
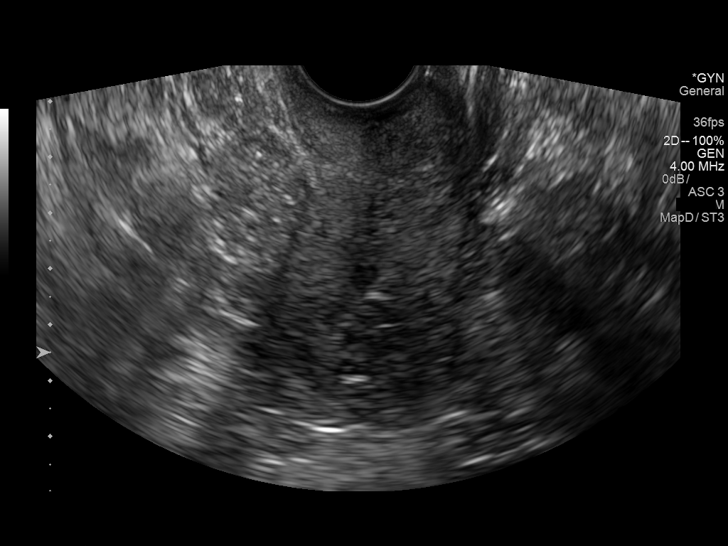
[im 37/37]
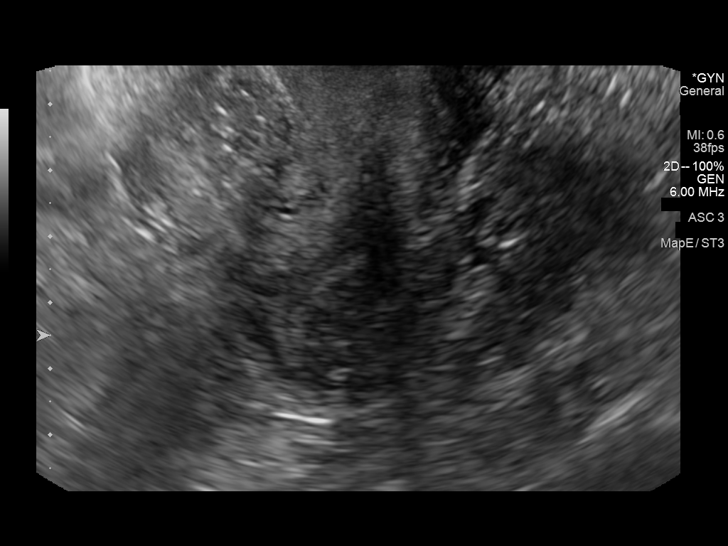

[13 of 25 positions shown; findings below may reference images not displayed]

FINDINGS: Uterus

Measurements: 7.4 x 3.6 x 4.3 cm. Normal morphology without mass

Endometrium

Thickness: 6 mm thick.  No endometrial fluid or focal abnormality

Right ovary

Not visualized on either transabdominal or endovaginal imaging
suspect obscured by bowel.

Left ovary

Not visualized on either transabdominal or endovaginal imaging
suspect obscured by bowel

Other findings

No free pelvic fluid or adnexal masses.

Patient was tender with transducer pressure.
IMPRESSION: Abnormal mild thickening of the endometrial complex for a
postmenopausal patient at 6 mm thick. In the setting of
post-menopausal bleeding, endometrial sampling is indicated to
exclude carcinoma. If results are benign, sonohysterogram should be
considered for focal lesion work-up. (Ref: Radiological Reasoning:
Algorithmic Workup of Abnormal Vaginal Bleeding with Endovaginal
Sonography and Sonohysterography. AJR 1111; 191:S68-73)

## 2019-01-12 DIAGNOSIS — K521 Toxic gastroenteritis and colitis: Secondary | ICD-10-CM | POA: Diagnosis not present

## 2019-01-12 DIAGNOSIS — N939 Abnormal uterine and vaginal bleeding, unspecified: Secondary | ICD-10-CM | POA: Diagnosis not present

## 2019-01-12 DIAGNOSIS — B3782 Candidal enteritis: Secondary | ICD-10-CM | POA: Diagnosis not present

## 2019-01-12 DIAGNOSIS — N951 Menopausal and female climacteric states: Secondary | ICD-10-CM | POA: Diagnosis not present

## 2019-01-12 DIAGNOSIS — B3789 Other sites of candidiasis: Secondary | ICD-10-CM | POA: Diagnosis not present

## 2019-01-12 DIAGNOSIS — E7212 Methylenetetrahydrofolate reductase deficiency: Secondary | ICD-10-CM | POA: Diagnosis not present

## 2019-01-12 DIAGNOSIS — E559 Vitamin D deficiency, unspecified: Secondary | ICD-10-CM | POA: Diagnosis not present

## 2019-01-12 DIAGNOSIS — R69 Illness, unspecified: Secondary | ICD-10-CM | POA: Diagnosis not present

## 2019-01-12 DIAGNOSIS — E039 Hypothyroidism, unspecified: Secondary | ICD-10-CM | POA: Diagnosis not present

## 2019-01-12 DIAGNOSIS — E639 Nutritional deficiency, unspecified: Secondary | ICD-10-CM | POA: Diagnosis not present

## 2020-05-10 DIAGNOSIS — R7982 Elevated C-reactive protein (CRP): Secondary | ICD-10-CM | POA: Diagnosis not present

## 2020-05-10 DIAGNOSIS — B3782 Candidal enteritis: Secondary | ICD-10-CM | POA: Diagnosis not present

## 2020-05-10 DIAGNOSIS — E039 Hypothyroidism, unspecified: Secondary | ICD-10-CM | POA: Diagnosis not present

## 2020-05-10 DIAGNOSIS — E063 Autoimmune thyroiditis: Secondary | ICD-10-CM | POA: Diagnosis not present

## 2020-12-24 DIAGNOSIS — H524 Presbyopia: Secondary | ICD-10-CM | POA: Diagnosis not present

## 2021-01-03 DIAGNOSIS — H1045 Other chronic allergic conjunctivitis: Secondary | ICD-10-CM | POA: Diagnosis not present

## 2021-01-08 DIAGNOSIS — H52209 Unspecified astigmatism, unspecified eye: Secondary | ICD-10-CM | POA: Diagnosis not present

## 2021-01-08 DIAGNOSIS — H5213 Myopia, bilateral: Secondary | ICD-10-CM | POA: Diagnosis not present

## 2021-01-08 DIAGNOSIS — H524 Presbyopia: Secondary | ICD-10-CM | POA: Diagnosis not present

## 2021-07-17 DIAGNOSIS — I781 Nevus, non-neoplastic: Secondary | ICD-10-CM | POA: Diagnosis not present

## 2021-11-06 DIAGNOSIS — Z0000 Encounter for general adult medical examination without abnormal findings: Secondary | ICD-10-CM | POA: Diagnosis not present

## 2021-11-06 DIAGNOSIS — E038 Other specified hypothyroidism: Secondary | ICD-10-CM | POA: Diagnosis not present

## 2022-07-29 LAB — LIPID PANEL
Cholesterol: 178 (ref 0–200)
HDL: 79 — AB (ref 35–70)
LDL Cholesterol: 83
Triglycerides: 86 (ref 40–160)

## 2022-07-29 LAB — HEPATIC FUNCTION PANEL
ALT: 12 U/L (ref 7–35)
AST: 18 (ref 13–35)
Alkaline Phosphatase: 74 (ref 25–125)
Bilirubin, Total: 0.4

## 2022-07-29 LAB — CBC: RBC: 4.33 (ref 3.87–5.11)

## 2022-07-29 LAB — CBC AND DIFFERENTIAL
HCT: 40 (ref 36–46)
Hemoglobin: 13.5 (ref 12.0–16.0)
Neutrophils Absolute: 3.3
Platelets: 249 10*3/uL (ref 150–400)
WBC: 6

## 2022-07-29 LAB — BASIC METABOLIC PANEL
BUN: 15 (ref 4–21)
CO2: 23 — AB (ref 13–22)
Chloride: 99 (ref 99–108)
Creatinine: 0.8 (ref 0.5–1.1)
Glucose: 93
Potassium: 4.3 mEq/L (ref 3.5–5.1)
Sodium: 137 (ref 137–147)

## 2022-07-29 LAB — COMPREHENSIVE METABOLIC PANEL
Albumin: 4.2 (ref 3.5–5.0)
Calcium: 9.4 (ref 8.7–10.7)
Globulin: 2.4
eGFR: 82

## 2022-07-29 LAB — HEMOGLOBIN A1C: Hemoglobin A1C: 5.9

## 2022-07-29 LAB — TSH: TSH: 0.15 — AB (ref 0.41–5.90)

## 2022-07-29 LAB — VITAMIN D 25 HYDROXY (VIT D DEFICIENCY, FRACTURES): Vit D, 25-Hydroxy: 85.8

## 2022-09-03 ENCOUNTER — Other Ambulatory Visit: Payer: Self-pay

## 2022-09-08 ENCOUNTER — Ambulatory Visit (INDEPENDENT_AMBULATORY_CARE_PROVIDER_SITE_OTHER): Payer: Medicare HMO | Admitting: Family Medicine

## 2022-09-08 ENCOUNTER — Encounter: Payer: Self-pay | Admitting: Family Medicine

## 2022-09-08 VITALS — BP 102/66 | HR 67 | Temp 97.8°F | Ht 64.25 in | Wt 117.2 lb

## 2022-09-08 DIAGNOSIS — E063 Autoimmune thyroiditis: Secondary | ICD-10-CM

## 2022-09-08 DIAGNOSIS — Z1211 Encounter for screening for malignant neoplasm of colon: Secondary | ICD-10-CM

## 2022-09-08 DIAGNOSIS — Z1231 Encounter for screening mammogram for malignant neoplasm of breast: Secondary | ICD-10-CM

## 2022-09-08 DIAGNOSIS — E038 Other specified hypothyroidism: Secondary | ICD-10-CM | POA: Diagnosis not present

## 2022-09-08 DIAGNOSIS — B370 Candidal stomatitis: Secondary | ICD-10-CM

## 2022-09-08 DIAGNOSIS — Z0000 Encounter for general adult medical examination without abnormal findings: Secondary | ICD-10-CM

## 2022-09-08 MED ORDER — LEVOTHYROXINE SODIUM 88 MCG PO TABS: 88.0000 ug | ORAL_TABLET | Freq: Every day | ORAL | 3 refills | Status: DC

## 2022-09-08 MED ORDER — NYSTATIN 100000 UNIT/ML MT SUSP: 5.0000 mL | Freq: Four times a day (QID) | OROMUCOSAL | 0 refills | Status: DC

## 2022-09-08 NOTE — Patient Instructions (Addendum)
Return in about 1 year (around 09/09/2023) for cpe (20 min), Routine chronic condition follow-up.        Great to see you today.  I have refilled the medication(s) we provide.   If labs were collected, we will inform you of lab results once received either by echart message or telephone call.   - echart message- for normal results that have been seen by the patient already.   - telephone call: abnormal results or if patient has not viewed results in their echart.

## 2022-09-08 NOTE — Progress Notes (Unsigned)
Patient ID: Taylor Bolton, female  DOB: May 03, 1945, 77 y.o.   MRN: [id number] Patient Care Team    Relationship Specialty Notifications Start End  Natalia Leatherwood, DO PCP - General Family Medicine  09/08/22   Genia Del, MD Consulting Physician Obstetrics and Gynecology  01/18/17   Davina Poke  Optometry  09/03/22   Sherrie Mustache Lawana Pai., DMD  Dentistry  09/03/22   Ferdinand Cava, MD Referring Physician   09/08/22    Comment: Functional intergrade of medicine    Chief Complaint  Patient presents with   Medical Management of Chronic Issues    TSH med transfer    Subjective:  Taylor Bolton is a 77 y.o.  female present for new patient establishment. All past medical history, surgical history, allergies, family history, immunizations, medications and social history were updated in the electronic medical record today. All recent labs, ED visits and hospitalizations within the last year were reviewed. Reviewed labs 07/29/2022  Lipid Panel: HDL 79, LDL 83, triglycerides 86 Thyroid: TSH 0.150, (T4)1.81 TPO 52 CBC within normal limits CMP within normal limits A1c 5.9 Vitamin D 85.8  Hypothyroidism:  Compliant with levo 88 mcg every day on an empty stomach.      09/08/2022    9:31 AM 03/24/2015   10:45 AM 03/10/2015   11:01 AM 06/29/2014    9:42 AM  Depression screen PHQ 2/9  Decreased Interest 0 0 0 0  Down, Depressed, Hopeless 0 0 0 0  PHQ - 2 Score 0 0 0 0       No data to display                09/08/2022    9:31 AM 06/29/2014    9:42 AM  Fall Risk   Falls in the past year? 0 No  Number falls in past yr: 0   Injury with Fall? 0   Follow up Falls evaluation completed     Immunization History  Administered Date(s) Administered   Tdap 09/11/2010    No results found.  Past Medical History:  Diagnosis Date   Abnormal Pap smear of cervix 11/02/2016   ASCUS, HPV neg.  Repeat pap 1 yr per GYN   Adrenal gland dysfunction (HCC)    Allergic rhinitis    Perennial;  with PND; treats with "natural" supplements   Fatigue    ? fibromyalgia per old records   Fibrocystic breast disease    Food allergy    +sensitivities (almonds)   History of intestinal parasite    giardia and roundworms   Hypothyroidism    managed by her naturopath   Insomnia    Intestinal candidiasis    Osteopenia    Pelvic congestion syndrome    Postmenopausal bleeding 10/2016   Endomet bx neg; pelvic sonohistogram to follow.  Estrogen/HRT to be weened off as of 10/2016 b/c she took it for > 24yrs and risk outweigh benefits.   Rash and nonspecific skin eruption    Initially treated as allergic dermatitis but didn't help; then her integrative med MD dx'd it as some sort of yeast infection and it cleared up with diflucan   Vitamin D deficiency    Xerosis of skin    Allergies  Allergen Reactions   Almond (Diagnostic)    Fluconazole Other (See Comments)    unspecified   Influenza Vaccines Other (See Comments)    unspecified   Shrimp (Diagnostic) Swelling   Sporanox [Itraconazole] Other (See Comments)  unspecified   Sudafed [Pseudoephedrine Hcl]    Past Surgical History:  Procedure Laterality Date   DILATION AND CURETTAGE OF UTERUS     ENDOMETRIAL BIOPSY  11/02/2016   Benign.  Sonohistogram ordered by GYN as f/u to this (dx postmenopausal vag bleeding).   TONSILLECTOMY AND ADENOIDECTOMY  1952   Family History  Problem Relation Age of Onset   Breast cancer Mother        dx'd in her 24s.   Prostate cancer Father    Cancer Father        prostate   Arthritis Sister    Stroke Maternal Grandmother    Hypertension Son    Social History   Social History Narrative   Married, 1 son and 1 daughter.  1 grandson.   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     -smoke alarm in the home:Yes     - wears seatbelt: Yes     - Feels safe in their relationships: Yes       Allergies as of 09/08/2022       Reactions   Almond (diagnostic)    Fluconazole  Other (See Comments)   unspecified   Influenza Vaccines Other (See Comments)   unspecified   Shrimp (diagnostic) Swelling   Sporanox [itraconazole] Other (See Comments)   unspecified   Sudafed [pseudoephedrine Hcl]         Medication List        Accurate as of September 08, 2022 11:59 PM. If you have any questions, ask your nurse or doctor.          DIGESTIVE ENZYME PO Take by mouth 2 (two) times daily with a meal.   EPA/GLA PO Take by mouth daily. (Contains Flaxseed oil, Borage oil, Fish oil, Vit E)   levothyroxine 88 MCG tablet Commonly known as: SYNTHROID Take 1 tablet (88 mcg total) by mouth daily before breakfast.   MULTIVITAMIN ADULT PO Take by mouth 2 (two) times daily.   NAC PO Take 900 mg by mouth daily.   nystatin 100000 UNIT/ML suspension Commonly known as: MYCOSTATIN Take 5 mLs (500,000 Units total) by mouth 4 (four) times daily. 5 ml swish for 30 seconds and then spit out, QID, do not swallow. Started by: Felix Pacini, DO   OVER THE COUNTER MEDICATION Take by mouth daily. Thyroid Metabolism + Iodine   OVER THE COUNTER MEDICATION Take by mouth in the morning, at noon, and at bedtime. Bone Up (Boron, Calcium, Collagen, Copper, Magnesium, Manganese, Potassium, Silicon, Vit C, Vit D, Vit K, Zinc)   UBIQUINOL PO Take 100 mg by mouth daily.   VITAMIN D-3 PO Take 5,000 Units by mouth daily.   VITEYES ESSENTIALS VISION SUPP PO Take by mouth daily.        All past medical history, surgical history, allergies, family history, immunizations andmedications were updated in the EMR today and reviewed under the history and medication portions of their EMR.    Recent Results (from the past 2160 hour(s))  Lipid panel     Status: Abnormal   Collection Time: 07/29/22 12:00 AM  Result Value Ref Range   Triglycerides 86 40 - 160   Cholesterol 178 0 - 200   HDL 79 (A) 35 - 70   LDL Cholesterol 83   CBC and differential     Status: None   Collection Time:  07/29/22 12:00 AM  Result Value Ref Range   Hemoglobin 13.5 12.0 - 16.0  HCT 40 36 - 46   Neutrophils Absolute 3.30    Platelets 249 150 - 400 K/uL   WBC 6.0   CBC     Status: None   Collection Time: 07/29/22 12:00 AM  Result Value Ref Range   RBC 4.33 3.87 - 5.11  VITAMIN D 25 Hydroxy (Vit-D Deficiency, Fractures)     Status: None   Collection Time: 07/29/22 12:00 AM  Result Value Ref Range   Vit D, 25-Hydroxy 85.8   Basic metabolic panel     Status: Abnormal   Collection Time: 07/29/22 12:00 AM  Result Value Ref Range   Glucose 93    BUN 15 4 - 21   CO2 23 (A) 13 - 22   Creatinine 0.8 0.5 - 1.1   Potassium 4.3 3.5 - 5.1 mEq/L   Sodium 137 137 - 147   Chloride 99 99 - 108  Comprehensive metabolic panel     Status: None   Collection Time: 07/29/22 12:00 AM  Result Value Ref Range   Globulin 2.4    eGFR 82    Calcium 9.4 8.7 - 10.7   Albumin 4.2 3.5 - 5.0  Hepatic function panel     Status: None   Collection Time: 07/29/22 12:00 AM  Result Value Ref Range   Alkaline Phosphatase 74 25 - 125   ALT 12 7 - 35 U/L   AST 18 13 - 35   Bilirubin, Total 0.4   Hemoglobin A1c     Status: None   Collection Time: 07/29/22 12:00 AM  Result Value Ref Range   Hemoglobin A1C 5.9   TSH     Status: Abnormal   Collection Time: 07/29/22 12:00 AM  Result Value Ref Range   TSH 0.15 (A) 0.41 - 5.90    ROS 14 pt review of systems performed and negative (unless mentioned in an HPI)  Objective: BP 102/66   Pulse 67   Temp 97.8 F (36.6 C)   Ht 5' 4.25" (1.632 m)   Wt 117 lb 3.2 oz (53.2 kg)   SpO2 98%   BMI 19.96 kg/m  Physical Exam Vitals and nursing note reviewed.  Constitutional:      General: She is not in acute distress.    Appearance: Normal appearance. She is not ill-appearing, toxic-appearing or diaphoretic.  HENT:     Head: Normocephalic and atraumatic.  Eyes:     General: No scleral icterus.       Right eye: No discharge.        Left eye: No discharge.      Extraocular Movements: Extraocular movements intact.     Conjunctiva/sclera: Conjunctivae normal.     Pupils: Pupils are equal, round, and reactive to light.  Cardiovascular:     Rate and Rhythm: Normal rate and regular rhythm.  Pulmonary:     Effort: Pulmonary effort is normal. No respiratory distress.     Breath sounds: Normal breath sounds. No wheezing, rhonchi or rales.  Musculoskeletal:     Right lower leg: No edema.     Left lower leg: No edema.  Skin:    General: Skin is warm.     Findings: No rash.  Neurological:     Mental Status: She is alert and oriented to person, place, and time. Mental status is at baseline.     Motor: No weakness.     Gait: Gait normal.  Psychiatric:        Mood and Affect: Mood normal.  Behavior: Behavior normal.        Thought Content: Thought content normal.        Judgment: Judgment normal.      Assessment/plan: KAILER MACLIN is a 77 y.o. female present for est care Encounter for medical examination to establish care Hypothyroidism due to Hashimoto's thyroiditis Reviewed recent collected TSH which was normal. Continue levothyroxine 88 mcg daily on an empty stomach. Follow-up yearly.  Thrush: She reports she had been working with her prior primary for recurrent thrush.  She had been taking Diflucan tabs.  She states she "cannot seem to get rid of the thrush " Nystatin swish prescribed.  No obvious thrush today, possible postnasal drip.  She is currently working with her dental team as well. Change out toothbrush after treatment.  Breast cancer screening by mammogram - MM 3D SCREENING MAMMOGRAM BILATERAL BREAST; Future -Patient is behind on her breast cancer screenings.  We discussed this today and she is agreeable to have her mammogram completed.  She does have concerns about exposure to radiation and does not want to complete mammograms yearly.  Colon cancer screening - Cologuard Discussed colon cancer screenings today and patient  has not had a colonoscopy.  We discussed the role of Cologuard screening and she is agreeable to have this today and understands the results will be positive or negative and depending upon those results she may then have to to go have a colonoscopy and she is agreeable to this.   Return in about 1 year (around 09/09/2023) for cpe (20 min), Routine chronic condition follow-up.  Orders Placed This Encounter  Procedures   MM 3D SCREENING MAMMOGRAM BILATERAL BREAST   Cologuard   Meds ordered this encounter  Medications   nystatin (MYCOSTATIN) 100000 UNIT/ML suspension    Sig: Take 5 mLs (500,000 Units total) by mouth 4 (four) times daily. 5 ml swish for 30 seconds and then spit out, QID, do not swallow.    Dispense:  100 mL    Refill:  0   levothyroxine (SYNTHROID) 88 MCG tablet    Sig: Take 1 tablet (88 mcg total) by mouth daily before breakfast.    Dispense:  90 tablet    Refill:  3   Referral Orders  No referral(s) requested today     Note is dictated utilizing voice recognition software. Although note has been proof read prior to signing, occasional typographical errors still can be missed. If any questions arise, please do not hesitate to call for verification.  Electronically signed by: Felix Pacini, DO [facility] [place]

## 2022-09-09 DIAGNOSIS — B370 Candidal stomatitis: Secondary | ICD-10-CM | POA: Insufficient documentation

## 2022-09-17 DIAGNOSIS — Z1211 Encounter for screening for malignant neoplasm of colon: Secondary | ICD-10-CM | POA: Diagnosis not present

## 2022-09-23 LAB — COLOGUARD: COLOGUARD: NEGATIVE

## 2022-10-19 ENCOUNTER — Ambulatory Visit
Admission: RE | Admit: 2022-10-19 | Discharge: 2022-10-19 | Disposition: A | Discharge status: Home / Self Care | Payer: Medicare HMO | Source: Ambulatory Visit | Attending: Family Medicine | Admitting: Family Medicine

## 2022-10-19 DIAGNOSIS — Z1231 Encounter for screening mammogram for malignant neoplasm of breast: Secondary | ICD-10-CM | POA: Diagnosis not present

## 2022-11-02 DIAGNOSIS — R5383 Other fatigue: Secondary | ICD-10-CM | POA: Diagnosis not present

## 2022-11-02 DIAGNOSIS — E639 Nutritional deficiency, unspecified: Secondary | ICD-10-CM | POA: Diagnosis not present

## 2022-11-02 DIAGNOSIS — B3782 Candidal enteritis: Secondary | ICD-10-CM | POA: Diagnosis not present

## 2022-11-02 DIAGNOSIS — R799 Abnormal finding of blood chemistry, unspecified: Secondary | ICD-10-CM | POA: Diagnosis not present

## 2022-11-02 DIAGNOSIS — R947 Abnormal results of other endocrine function studies: Secondary | ICD-10-CM | POA: Diagnosis not present

## 2022-11-02 DIAGNOSIS — B3789 Other sites of candidiasis: Secondary | ICD-10-CM | POA: Diagnosis not present

## 2022-11-02 DIAGNOSIS — E039 Hypothyroidism, unspecified: Secondary | ICD-10-CM | POA: Diagnosis not present

## 2022-11-02 DIAGNOSIS — E063 Autoimmune thyroiditis: Secondary | ICD-10-CM | POA: Diagnosis not present

## 2022-11-02 DIAGNOSIS — R946 Abnormal results of thyroid function studies: Secondary | ICD-10-CM | POA: Diagnosis not present

## 2022-11-02 DIAGNOSIS — D6489 Other specified anemias: Secondary | ICD-10-CM | POA: Diagnosis not present

## 2022-11-02 DIAGNOSIS — R7301 Impaired fasting glucose: Secondary | ICD-10-CM | POA: Diagnosis not present

## 2022-11-02 DIAGNOSIS — E559 Vitamin D deficiency, unspecified: Secondary | ICD-10-CM | POA: Diagnosis not present

## 2022-12-18 DIAGNOSIS — H5213 Myopia, bilateral: Secondary | ICD-10-CM | POA: Diagnosis not present

## 2023-02-16 DIAGNOSIS — R030 Elevated blood-pressure reading, without diagnosis of hypertension: Secondary | ICD-10-CM | POA: Diagnosis not present

## 2023-02-16 DIAGNOSIS — Z888 Allergy status to other drugs, medicaments and biological substances status: Secondary | ICD-10-CM | POA: Diagnosis not present

## 2023-02-16 DIAGNOSIS — Z008 Encounter for other general examination: Secondary | ICD-10-CM | POA: Diagnosis not present

## 2023-02-16 DIAGNOSIS — E069 Thyroiditis, unspecified: Secondary | ICD-10-CM | POA: Diagnosis not present

## 2023-02-16 DIAGNOSIS — E785 Hyperlipidemia, unspecified: Secondary | ICD-10-CM | POA: Diagnosis not present

## 2023-02-16 DIAGNOSIS — E039 Hypothyroidism, unspecified: Secondary | ICD-10-CM | POA: Diagnosis not present

## 2023-02-16 DIAGNOSIS — N182 Chronic kidney disease, stage 2 (mild): Secondary | ICD-10-CM | POA: Diagnosis not present

## 2023-02-16 DIAGNOSIS — Z79890 Hormone replacement therapy (postmenopausal): Secondary | ICD-10-CM | POA: Diagnosis not present

## 2023-09-09 ENCOUNTER — Ambulatory Visit (INDEPENDENT_AMBULATORY_CARE_PROVIDER_SITE_OTHER): Payer: Self-pay | Admitting: Family Medicine

## 2023-09-09 ENCOUNTER — Encounter: Payer: Self-pay | Admitting: Family Medicine

## 2023-09-09 VITALS — BP 102/60 | HR 65 | Temp 97.9°F | Ht 64.0 in | Wt 125.4 lb

## 2023-09-09 DIAGNOSIS — M8580 Other specified disorders of bone density and structure, unspecified site: Secondary | ICD-10-CM

## 2023-09-09 DIAGNOSIS — Z0000 Encounter for general adult medical examination without abnormal findings: Secondary | ICD-10-CM

## 2023-09-09 DIAGNOSIS — Z1159 Encounter for screening for other viral diseases: Secondary | ICD-10-CM

## 2023-09-09 DIAGNOSIS — M949 Disorder of cartilage, unspecified: Secondary | ICD-10-CM

## 2023-09-09 DIAGNOSIS — E063 Autoimmune thyroiditis: Secondary | ICD-10-CM

## 2023-09-09 DIAGNOSIS — Z131 Encounter for screening for diabetes mellitus: Secondary | ICD-10-CM | POA: Diagnosis not present

## 2023-09-09 DIAGNOSIS — Z0189 Encounter for other specified special examinations: Secondary | ICD-10-CM | POA: Diagnosis not present

## 2023-09-09 DIAGNOSIS — Z1231 Encounter for screening mammogram for malignant neoplasm of breast: Secondary | ICD-10-CM

## 2023-09-09 DIAGNOSIS — M899 Disorder of bone, unspecified: Secondary | ICD-10-CM

## 2023-09-09 DIAGNOSIS — Z13220 Encounter for screening for lipoid disorders: Secondary | ICD-10-CM

## 2023-09-09 DIAGNOSIS — Z23 Encounter for immunization: Secondary | ICD-10-CM | POA: Diagnosis not present

## 2023-09-09 LAB — CBC
HCT: 42 % (ref 36.0–46.0)
Hemoglobin: 14 g/dL (ref 12.0–15.0)
MCHC: 33.4 g/dL (ref 30.0–36.0)
MCV: 96 fl (ref 78.0–100.0)
Platelets: 264 10*3/uL (ref 150.0–400.0)
RBC: 4.37 Mil/uL (ref 3.87–5.11)
RDW: 12.8 % (ref 11.5–15.5)
WBC: 6.2 10*3/uL (ref 4.0–10.5)

## 2023-09-09 LAB — COMPREHENSIVE METABOLIC PANEL WITH GFR
ALT: 15 U/L (ref 0–35)
AST: 21 U/L (ref 0–37)
Albumin: 4.6 g/dL (ref 3.5–5.2)
Alkaline Phosphatase: 67 U/L (ref 39–117)
BUN: 17 mg/dL (ref 6–23)
CO2: 32 meq/L (ref 19–32)
Calcium: 9.6 mg/dL (ref 8.4–10.5)
Chloride: 99 meq/L (ref 96–112)
Creatinine, Ser: 0.67 mg/dL (ref 0.40–1.20)
GFR: 85.21 mL/min (ref >60.00)
Glucose, Bld: 63 mg/dL — ABNORMAL LOW (ref 70–99)
Potassium: 4.5 meq/L (ref 3.5–5.1)
Sodium: 137 meq/L (ref 135–145)
Total Bilirubin: 0.6 mg/dL (ref 0.2–1.2)
Total Protein: 7.1 g/dL (ref 6.0–8.3)

## 2023-09-09 LAB — HEMOGLOBIN A1C: Hgb A1c MFr Bld: 6.1 % (ref 4.6–6.5)

## 2023-09-09 LAB — LIPID PANEL
Cholesterol: 220 mg/dL — ABNORMAL HIGH (ref 0–200)
HDL: 85.3 mg/dL (ref >39.00)
LDL Cholesterol: 115 mg/dL — ABNORMAL HIGH (ref 0–99)
NonHDL: 135.08
Total CHOL/HDL Ratio: 3
Triglycerides: 101 mg/dL (ref 0.0–149.0)
VLDL: 20.2 mg/dL (ref 0.0–40.0)

## 2023-09-09 LAB — VITAMIN D 25 HYDROXY (VIT D DEFICIENCY, FRACTURES): VITD: 63.96 ng/mL (ref 30.00–100.00)

## 2023-09-09 LAB — TSH: TSH: 4.22 u[IU]/mL (ref 0.35–5.50)

## 2023-09-09 MED ORDER — LEVOTHYROXINE SODIUM 88 MCG PO TABS: 88.0000 ug | ORAL_TABLET | Freq: Every day | ORAL | 3 refills | Status: DC

## 2023-09-09 MED ORDER — TETANUS-DIPHTH-ACELL PERTUSSIS 5-2.5-18.5 LF-MCG/0.5 IM SUSP: 0.5000 mL | Freq: Once | INTRAMUSCULAR | 0 refills | Status: AC

## 2023-09-09 NOTE — Progress Notes (Signed)
 Patient ID: Taylor Bolton, female  DOB: 1945/09/04, 78 y.o.   MRN: [id number] Patient Care Team    Relationship Specialty Notifications Start End  Catherine Charlies LABOR, DO PCP - General Family Medicine  09/08/22   Lavoie, Marie-Lyne, MD Consulting Physician Obstetrics and Gynecology  01/18/17   Abigail Maude POUR  Optometry  09/03/22   Gasper Alm CHRISTELLA Raddle., DMD  Dentistry  09/03/22   Gail Bergeron, MD Referring Physician   09/08/22    Comment: Functional intergrade of medicine    Chief Complaint  Patient presents with   Annual Exam    Chronic Conditions/illness Management.  Pt is fasting.      Subjective:  Taylor Bolton is a 78 y.o.  Female  present for CPE and Chronic Conditions/illness Management All past medical history, surgical history, allergies, family history, immunizations, medications and social history were updated in the electronic medical record today. All recent labs, ED visits and hospitalizations within the last year were reviewed.  Health maintenance:  Colon cancer screen: completed cologuard 09/2022,no further screening required d/t age Mammogram: fh breast ca in mother (68s) completed:10/19/2022, mobile unit OR>ordered Immunizations: tdap -printed, Influenza  > allergic, PNA series -declined, shingrix declined Infectious disease screening: Hep C collected today DEXA: last completed 07/18/2014, result -1.7> ordered Assistive device: none Oxygen [id number] Patient has a Dental home. Hospitalizations/ED visits: reviewed  Hypothyroidism:  Compliant with levo 88 mcg every day on an empty stomach.      09/09/2023   10:12 AM 09/08/2022    9:31 AM 03/24/2015   10:45 AM 03/10/2015   11:01 AM 06/29/2014    9:42 AM  Depression screen PHQ 2/9  Decreased Interest 0 0 0 0 0  Down, Depressed, Hopeless 0 0 0 0 0  PHQ - 2 Score 0 0 0 0 0       No data to display                 Immunization History  Administered Date(s) Administered   Tdap 09/11/2010     Past Medical  History:  Diagnosis Date   Abnormal Pap smear of cervix 11/02/2016   ASCUS, HPV neg.  Repeat pap 1 yr per GYN   Adrenal gland dysfunction (HCC)    Allergic rhinitis    Perennial; with PND; treats with natural supplements   Fatigue    ? fibromyalgia per old records   Fibrocystic breast disease    Food allergy    +sensitivities (almonds)   History of intestinal parasite    giardia and roundworms   Hypothyroidism    managed by her naturopath   Insomnia    Intestinal candidiasis    Osteopenia    Pelvic congestion syndrome    Postmenopausal bleeding 10/2016   Endomet bx neg; pelvic sonohistogram to follow.  Estrogen/HRT to be weened off as of 10/2016 b/c she took it for > 90yrs and risk outweigh benefits.   Rash and nonspecific skin eruption    Initially treated as allergic dermatitis but didn't help; then her integrative med MD dx'd it as some sort of yeast infection and it cleared up with diflucan   Vitamin D  deficiency    Xerosis of skin    Allergies  Allergen Reactions   Almond (Diagnostic)    Fluconazole Other (See Comments)    unspecified   Influenza Vaccines Other (See Comments)    unspecified   Shrimp (Diagnostic) Swelling   Sporanox [Itraconazole] Other (See Comments)  unspecified   Sudafed [Pseudoephedrine Hcl]    Past Surgical History:  Procedure Laterality Date   DILATION AND CURETTAGE OF UTERUS     ENDOMETRIAL BIOPSY  11/02/2016   Benign.  Sonohistogram ordered by GYN as f/u to this (dx postmenopausal vag bleeding).   TONSILLECTOMY AND ADENOIDECTOMY  1952   Family History  Problem Relation Age of Onset   Breast cancer Mother        dx'd in her 48s.   Prostate cancer Father    Cancer Father        prostate   Arthritis Sister    Stroke Maternal Grandmother    Hypertension Son    Social History   Social History Narrative   Married, 1 son and 1 daughter.  1 grandson.   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     -smoke  alarm in the home:Yes     - wears seatbelt: Yes     - Feels safe in their relationships: Yes       Allergies as of 09/09/2023       Reactions   Almond (diagnostic)    Fluconazole Other (See Comments)   unspecified   Influenza Vaccines Other (See Comments)   unspecified   Shrimp (diagnostic) Swelling   Sporanox [itraconazole] Other (See Comments)   unspecified   Sudafed [pseudoephedrine Hcl]         Medication List        Accurate as of September 09, 2023 10:13 AM. If you have any questions, ask your nurse or doctor.          DIGESTIVE ENZYME PO Take by mouth 2 (two) times daily with a meal.   EPA/GLA PO Take by mouth daily. (Contains Flaxseed oil, Borage oil, Fish oil, Vit E)   levothyroxine  88 MCG tablet Commonly known as: SYNTHROID  Take 1 tablet (88 mcg total) by mouth daily before breakfast.   MULTIVITAMIN ADULT PO Take by mouth 2 (two) times daily.   NAC PO Take 900 mg by mouth daily.   nystatin  100000 UNIT/ML suspension Commonly known as: MYCOSTATIN  Take 5 mLs (500,000 Units total) by mouth 4 (four) times daily. 5 ml swish for 30 seconds and then spit out, QID, do not swallow.   OVER THE COUNTER MEDICATION Take by mouth daily. Thyroid  Metabolism + Iodine   OVER THE COUNTER MEDICATION Take by mouth in the morning, at noon, and at bedtime. Bone Up (Boron, Calcium, Collagen, Copper, Magnesium, Manganese, Potassium, Silicon, Vit C, Vit D, Vit K, Zinc)   Tdap 5-2.5-18.5 LF-MCG/0.5 injection Commonly known as: BOOSTRIX Inject 0.5 mLs into the muscle once for 1 dose. Started by: Jaelle Campanile   UBIQUINOL PO Take 100 mg by mouth daily.   VITAMIN D -3 PO Take 5,000 Units by mouth daily.   VITEYES ESSENTIALS VISION SUPP PO Take by mouth daily.        All past medical history, surgical history, allergies, family history, immunizations andmedications were updated in the EMR today and reviewed under the history and medication portions of their EMR.     No  results found for this or any previous visit (from the past 2160 hours).  MM 3D SCREENING MAMMOGRAM BILATERAL BREAST Result Date: 10/20/2022 CLINICAL DATA:  Screening. EXAM: DIGITAL SCREENING BILATERAL MAMMOGRAM WITH TOMOSYNTHESIS AND CAD TECHNIQUE: Bilateral screening digital craniocaudal and mediolateral oblique mammograms were obtained. Bilateral screening digital breast tomosynthesis was performed. The images were evaluated with computer-aided detection. COMPARISON:  Previous exam(s).  ACR Breast Density Category b: There are scattered areas of fibroglandular density. FINDINGS: There are no findings suspicious for malignancy. IMPRESSION: No mammographic evidence of malignancy. A result letter of this screening mammogram will be mailed directly to the patient. RECOMMENDATION: Screening mammogram in one year. (Code:SM-B-01Y) BI-RADS CATEGORY  1: Negative. Electronically Signed   By: Bard Moats M.D.   On: 10/20/2022 15:09    ROS 14 pt review of systems performed and negative (unless mentioned in an HPI)  Objective: BP 102/60   Pulse 65   Temp 97.9 F (36.6 C)   Ht 5' 4 (1.626 m)   Wt 125 lb 6.4 oz (56.9 kg)   SpO2 97%   BMI 21.52 kg/m  Physical Exam Vitals and nursing note reviewed.  Constitutional:      General: She is not in acute distress.    Appearance: Normal appearance. She is not ill-appearing or toxic-appearing.  HENT:     Head: Normocephalic and atraumatic.     Right Ear: Tympanic membrane, ear canal and external ear normal. There is no impacted cerumen.     Left Ear: Tympanic membrane, ear canal and external ear normal. There is no impacted cerumen.     Nose: No congestion or rhinorrhea.     Mouth/Throat:     Mouth: Mucous membranes are moist.     Pharynx: Oropharynx is clear. No oropharyngeal exudate or posterior oropharyngeal erythema.  Eyes:     General: No scleral icterus.       Right eye: No discharge.        Left eye: No discharge.     Extraocular Movements:  Extraocular movements intact.     Conjunctiva/sclera: Conjunctivae normal.     Pupils: Pupils are equal, round, and reactive to light.  Cardiovascular:     Rate and Rhythm: Normal rate and regular rhythm.     Pulses: Normal pulses.     Heart sounds: Normal heart sounds. No murmur heard.    No friction rub. No gallop.  Pulmonary:     Effort: Pulmonary effort is normal. No respiratory distress.     Breath sounds: Normal breath sounds. No stridor. No wheezing, rhonchi or rales.  Chest:     Chest wall: No tenderness.  Abdominal:     General: Abdomen is flat. Bowel sounds are normal. There is no distension.     Palpations: Abdomen is soft. There is no mass.     Tenderness: There is no abdominal tenderness. There is no right CVA tenderness, left CVA tenderness, guarding or rebound.     Hernia: No hernia is present.  Musculoskeletal:        General: No swelling, tenderness or deformity. Normal range of motion.     Cervical back: Normal range of motion and neck supple. No rigidity or tenderness.     Right lower leg: No edema.     Left lower leg: No edema.  Lymphadenopathy:     Cervical: No cervical adenopathy.  Skin:    General: Skin is warm and dry.     Coloration: Skin is not jaundiced or pale.     Findings: No bruising, erythema, lesion or rash.  Neurological:     General: No focal deficit present.     Mental Status: She is alert and oriented to person, place, and time. Mental status is at baseline.     Cranial Nerves: No cranial nerve deficit.     Sensory: No sensory deficit.     Motor: No weakness.  Coordination: Coordination normal.     Gait: Gait normal.     Deep Tendon Reflexes: Reflexes normal.  Psychiatric:        Mood and Affect: Mood normal.        Behavior: Behavior normal.        Thought Content: Thought content normal.        Judgment: Judgment normal.       No results found.  Assessment/plan: Taylor Bolton is a 78 y.o. female present for CPE and Chronic  Conditions/illness Management Routine general medical examination at a health care facility (Primary) - CBC - Comprehensive metabolic panel with GFR - Hemoglobin A1c Patient was encouraged to exercise greater than 150 minutes a week. Patient was encouraged to choose a diet filled with fresh fruits and vegetables, and lean meats. AVS provided to patient today for education/recommendation on gender specific health and safety maintenance. Colon cancer screen: completed cologuard 09/2022,no further screening required d/t age Mammogram: fh breast ca in mother (31s) completed:10/19/2022, mobile unit OR>ordered Immunizations: tdap -printed, Influenza  > allergic, PNA series -declined, shingrix declined Infectious disease screening: Hep C collected today DEXA: last completed 07/18/2014, result -1.7> ordered Hypothyroidism due to Hashimoto thyroiditis Continue levo 88 mcg every day. refills will be provided in appropriate dose based on lab result today - CBC - Comprehensive metabolic panel with GFR - Lipid panel - TSH Need for Tdap vaccination - Tdap (BOOSTRIX) 5-2.5-18.5 LF-MCG/0.5 injection; Inject 0.5 mLs into the muscle once for 1 dose.  Dispense: 0.5 mL; Refill: 0 Encounter for hepatitis C screening test for low risk patient declined Breast cancer screening by mammogram - MM 3D SCREENING MAMMOGRAM BILATERAL BREAST; Future Lipid screening - Lipid panel Diabetes mellitus screening - Hemoglobin A1c Osteopenia, unspecified location - DG Bone Density; Future - Vitamin D  (25 hydroxy) Disorder of bone and articular cartilage - Vitamin D  (25 hydroxy) Need for vaccination against Streptococcus pneumoniae declined    Return in about 1 year (around 09/09/2024) for cpe (20 min), Routine chronic condition follow-up.   Orders Placed This Encounter  Procedures   DG Bone Density   MM 3D SCREENING MAMMOGRAM BILATERAL BREAST   CBC   Comprehensive metabolic panel with GFR   Hemoglobin A1c   Lipid  panel   TSH   Vitamin D  (25 hydroxy)   Meds ordered this encounter  Medications   Tdap (BOOSTRIX) 5-2.5-18.5 LF-MCG/0.5 injection    Sig: Inject 0.5 mLs into the muscle once for 1 dose.    Dispense:  0.5 mL    Refill:  0   levothyroxine  (SYNTHROID ) 88 MCG tablet    Sig: Take 1 tablet (88 mcg total) by mouth daily before breakfast.    Dispense:  90 tablet    Refill:  3   Referral Orders  No referral(s) requested today     Electronically signed by: Charlies Bellini, DO [facility] [place]

## 2023-09-09 NOTE — Patient Instructions (Addendum)

## 2023-09-10 LAB — HEPATITIS C ANTIBODY: Hepatitis C Ab: NONREACTIVE

## 2023-09-13 ENCOUNTER — Ambulatory Visit: Payer: Self-pay | Admitting: Family Medicine

## 2023-09-13 NOTE — Telephone Encounter (Signed)
 Pt advised of results.

## 2023-10-12 DIAGNOSIS — M8580 Other specified disorders of bone density and structure, unspecified site: Secondary | ICD-10-CM

## 2023-10-21 ENCOUNTER — Other Ambulatory Visit: Payer: Self-pay | Admitting: Family Medicine

## 2023-10-28 ENCOUNTER — Ambulatory Visit (HOSPITAL_BASED_OUTPATIENT_CLINIC_OR_DEPARTMENT_OTHER)
Admission: RE | Admit: 2023-10-28 | Discharge: 2023-10-28 | Disposition: A | Discharge status: Home / Self Care | Source: Ambulatory Visit | Attending: Family Medicine | Admitting: Family Medicine

## 2023-10-28 DIAGNOSIS — Z780 Asymptomatic menopausal state: Secondary | ICD-10-CM | POA: Insufficient documentation

## 2023-10-28 DIAGNOSIS — M8580 Other specified disorders of bone density and structure, unspecified site: Secondary | ICD-10-CM

## 2023-10-28 DIAGNOSIS — Z13820 Encounter for screening for osteoporosis: Secondary | ICD-10-CM | POA: Diagnosis not present

## 2023-10-28 DIAGNOSIS — M8589 Other specified disorders of bone density and structure, multiple sites: Secondary | ICD-10-CM | POA: Diagnosis not present

## 2023-10-29 ENCOUNTER — Ambulatory Visit: Payer: Self-pay | Admitting: Family Medicine

## 2023-10-29 DIAGNOSIS — M8580 Other specified disorders of bone density and structure, unspecified site: Secondary | ICD-10-CM | POA: Insufficient documentation

## 2023-12-14 ENCOUNTER — Ambulatory Visit: Payer: Self-pay

## 2023-12-14 NOTE — Telephone Encounter (Signed)
 FYI Only or Action Required?: FYI only for provider.  Patient was last seen in primary care on 09/09/2023 by [name], DO.  Called Nurse Triage reporting Fatigue.  Symptoms began a couple of weeks ago.  Interventions attempted: Rest, hydration, or home remedies.  Symptoms are: gradually worsening.  Triage Disposition: See PCP When Office is Open (Within 3 Days)  Patient/caregiver understands and will follow disposition?: Yes     Copied from CRM #[id number]. Topic: Clinical - Red Word Triage >> Dec 14, 2023 10:56 AM Franky GRADE wrote: Red Word that prompted transfer to Nurse Triage: Patient is calling because she's been experiencing tiredness for about a week now, she also states she checks her pulse and feels like it's faint.      Reason for Disposition  [1] Fatigue (i.e., tires easily, decreased energy) AND [2] persists > 1 week  Answer Assessment - Initial Assessment Questions 1. DESCRIPTION: Describe how you are feeling.     Feeling very tired  2. SEVERITY: How bad is it?  Can you stand and walk?     Moderate  3. ONSET: When did these symptoms begin? (e.g., hours, days, weeks, months)     A couple of weeks ago  4. CAUSE: What do you think is causing the weakness or fatigue? (e.g., not drinking enough fluids, medical problem, trouble sleeping)     Unsure  5. NEW MEDICINES:  Have you started on any new medicines recently? (e.g., opioid pain medicines, benzodiazepines, muscle relaxants, antidepressants, antihistamines, neuroleptics, beta blockers)     No 6. OTHER SYMPTOMS: Do you have any other symptoms? (e.g., chest pain, fever, cough, SOB, vomiting, diarrhea, bleeding, other areas of pain)     No  Protocols used: Weakness (Generalized) and Fatigue-A-AH

## 2023-12-14 NOTE — Telephone Encounter (Signed)
 No further action needed.

## 2023-12-15 ENCOUNTER — Ambulatory Visit (INDEPENDENT_AMBULATORY_CARE_PROVIDER_SITE_OTHER): Admitting: Family Medicine

## 2023-12-15 ENCOUNTER — Ambulatory Visit: Attending: Family Medicine

## 2023-12-15 ENCOUNTER — Encounter: Payer: Self-pay | Admitting: Family Medicine

## 2023-12-15 VITALS — BP 104/68 | HR 70 | Temp 98.0°F | Wt 126.6 lb

## 2023-12-15 DIAGNOSIS — R000 Tachycardia, unspecified: Secondary | ICD-10-CM

## 2023-12-15 DIAGNOSIS — R5383 Other fatigue: Secondary | ICD-10-CM | POA: Diagnosis not present

## 2023-12-15 DIAGNOSIS — E063 Autoimmune thyroiditis: Secondary | ICD-10-CM

## 2023-12-15 NOTE — Progress Notes (Unsigned)
 EP to read.

## 2023-12-15 NOTE — Progress Notes (Signed)
 Taylor Bolton , [birth year], 78 y.o., female MRN: [id number] Patient Care Team    Relationship Specialty Notifications Start End  Catherine Charlies LABOR, DO PCP - General Family Medicine  09/08/22   Lavoie, Marie-Lyne, MD Consulting Physician Obstetrics and Gynecology  01/18/17   Abigail Maude POUR  Optometry  09/03/22   Gasper Alm CHRISTELLA Raddle., DMD  Dentistry  09/03/22   Gail Bergeron, MD Referring Physician   09/08/22    Comment: Functional intergrade of medicine    Chief Complaint  Patient presents with   Fatigue    3 weeks; pt states she feels drained all the time. Also c/o mild sob.      Subjective: Taylor Bolton is a 78 y.o. Pt presents for an OV with complaints of increased fatigue of 3 weeks duration.  Associated symptoms include feels drained all the time.  Patient has history of hypothyroidism and reports compliance with levothyroxine  88 mcg daily.   Patient reports she has been having episodes of heart racing, which creates her feeling winded.  She states she is not short of breath, she just does not feel like she can take in a deep breath and she is fatigued.  She endorses an episode of some mild chest pressure a few days ago, which has resolved. She denies chest pain, dizziness, syncope. Last labs July 2025: Vitamin D  normal, CBC normal, TSH normal, negative hepatitis C screening, mildly low glucose at 63 with a A1c of 6.1.  CMP normal  She states she thinks she is under too much stress dealing with stuff and other people.  She reports she is, probably pushing herself too hard.    She denies any blood loss, rectal bleeding, change in diet. She reports she does not sleep very well.  She wakes up multiple times throughout the night to use the bathroom.  She endorses having a hard time falling asleep, but will take a melatonin if needed.     09/09/2023   10:12 AM 09/08/2022    9:31 AM 03/24/2015   10:45 AM 03/10/2015   11:01 AM 06/29/2014    9:42 AM  Depression screen PHQ 2/9  Decreased  Interest 0 0 0 0 0  Down, Depressed, Hopeless 0 0 0 0 0  PHQ - 2 Score 0 0 0 0 0    Allergies  Allergen Reactions   Almond (Diagnostic)    Fluconazole Other (See Comments)    unspecified   Influenza Vaccines Other (See Comments)    unspecified   Shrimp (Diagnostic) Swelling   Sporanox [Itraconazole] Other (See Comments)    unspecified   Sudafed [Pseudoephedrine Hcl]    Social History   Social History Narrative   Married, 1 son and 1 daughter.  1 grandson.   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     -smoke alarm in the home:Yes     - wears seatbelt: Yes     - Feels safe in their relationships: Yes      Past Medical History:  Diagnosis Date   Abnormal Pap smear of cervix 11/02/2016   ASCUS, HPV neg.  Repeat pap 1 yr per GYN   Adrenal gland dysfunction    Allergic rhinitis    Perennial; with PND; treats with natural supplements   Fatigue    ? fibromyalgia per old records   Fibrocystic breast disease    Food allergy    +sensitivities (almonds)   History of intestinal  parasite    giardia and roundworms   Hypothyroidism    managed by her naturopath   Insomnia    Intestinal candidiasis    Osteopenia    Pelvic congestion syndrome    Postmenopausal bleeding 10/2016   Endomet bx neg; pelvic sonohistogram to follow.  Estrogen/HRT to be weened off as of 10/2016 b/c she took it for > 68yrs and risk outweigh benefits.   Rash and nonspecific skin eruption    Initially treated as allergic dermatitis but didn't help; then her integrative med MD dx'd it as some sort of yeast infection and it cleared up with diflucan   Vitamin D  deficiency    Xerosis of skin    Past Surgical History:  Procedure Laterality Date   DILATION AND CURETTAGE OF UTERUS     ENDOMETRIAL BIOPSY  11/02/2016   Benign.  Sonohistogram ordered by GYN as f/u to this (dx postmenopausal vag bleeding).   TONSILLECTOMY AND ADENOIDECTOMY  1952   Family History  Problem Relation Age of  Onset   Breast cancer Mother        dx'd in her 1s.   Prostate cancer Father    Cancer Father        prostate   Arthritis Sister    Stroke Maternal Grandmother    Hypertension Son    Allergies as of 12/15/2023       Reactions   Almond (diagnostic)    Fluconazole Other (See Comments)   unspecified   Influenza Vaccines Other (See Comments)   unspecified   Shrimp (diagnostic) Swelling   Sporanox [itraconazole] Other (See Comments)   unspecified   Sudafed [pseudoephedrine Hcl]         Medication List        Accurate as of December 15, 2023  2:45 PM. If you have any questions, ask your nurse or doctor.          STOP taking these medications    nystatin  100000 UNIT/ML suspension Commonly known as: MYCOSTATIN  Stopped by: Charlies Bellini       TAKE these medications    DIGESTIVE ENZYME PO Take by mouth 2 (two) times daily with a meal.   EPA/GLA PO Take by mouth daily. (Contains Flaxseed oil, Borage oil, Fish oil, Vit E)   levothyroxine  88 MCG tablet Commonly known as: SYNTHROID  Take 1 tablet (88 mcg total) by mouth daily before breakfast.   MULTIVITAMIN ADULT PO Take by mouth 2 (two) times daily.   NAC PO Take 900 mg by mouth daily.   OVER THE COUNTER MEDICATION Take by mouth daily. Thyroid  Metabolism + Iodine   OVER THE COUNTER MEDICATION Take by mouth in the morning, at noon, and at bedtime. Bone Up (Boron, Calcium, Collagen, Copper, Magnesium, Manganese, Potassium, Silicon, Vit C, Vit D, Vit K, Zinc)   UBIQUINOL PO Take 100 mg by mouth daily.   VITAMIN D -3 PO Take 5,000 Units by mouth daily.   VITEYES ESSENTIALS VISION SUPP PO Take by mouth daily.        All past medical history, surgical history, allergies, family history, immunizations andmedications were updated in the EMR today and reviewed under the history and medication portions of their EMR.     ROS Negative, with the exception of above mentioned in HPI   Objective:  BP 104/68    Pulse 70   Temp 98 F (36.7 C)   Wt 126 lb 9.6 oz (57.4 kg)   SpO2 98%   BMI 21.73 kg/m  Body  mass index is 21.73 kg/m. Physical Exam Vitals and nursing note reviewed.  Constitutional:      General: She is not in acute distress.    Appearance: Normal appearance. She is not ill-appearing, toxic-appearing or diaphoretic.  HENT:     Head: Normocephalic and atraumatic.  Eyes:     General: No scleral icterus.       Right eye: No discharge.        Left eye: No discharge.     Extraocular Movements: Extraocular movements intact.     Conjunctiva/sclera: Conjunctivae normal.     Pupils: Pupils are equal, round, and reactive to light.  Neck:     Comments: No thyroid  nodules palpated Cardiovascular:     Rate and Rhythm: Normal rate and regular rhythm.     Heart sounds: No murmur heard. Pulmonary:     Effort: Pulmonary effort is normal. No respiratory distress.     Breath sounds: Normal breath sounds. No wheezing, rhonchi or rales.  Musculoskeletal:     Cervical back: Neck supple.     Right lower leg: No edema.     Left lower leg: No edema.  Lymphadenopathy:     Cervical: No cervical adenopathy.  Skin:    General: Skin is warm.     Findings: No rash.  Neurological:     Mental Status: She is alert and oriented to person, place, and time. Mental status is at baseline.     Motor: No weakness.     Gait: Gait normal.  Psychiatric:        Mood and Affect: Mood normal.        Behavior: Behavior normal.        Thought Content: Thought content normal.        Judgment: Judgment normal.     No results found. No results found. No results found for this or any previous visit (from the past 24 hours).  Assessment/Plan: Taylor Bolton is a 78 y.o. 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  She reports she thinks it is probably just stress and she is pushing herself  up but declines referral to therapist or consideration of medication start today. We discussed starting with laboratory workup and ZIO monitoring to ensure she is not experiencing A-fib with the heart racing causing her to have more fatigue and rule out other vitamin, mineral, anemia or endocrine disorder.  She is agreeable to this approach today. - Comp Met (CMET) - CBC w/Diff - TSH - B12 and Folate Panel - Vitamin D  (25 hydroxy) - Iron, TIBC and Ferritin Panel - LONG TERM MONITOR (3-14 DAYS); Future   Reviewed expectations re: course of current medical issues. Discussed self-management of symptoms. Outlined signs and symptoms indicating need for more acute intervention. Patient verbalized understanding and all questions were answered. Patient received an After-Visit Summary.    Orders Placed This Encounter  Procedures   Comp Met (CMET)   CBC w/Diff   TSH   B12 and Folate Panel   Vitamin D  (25 hydroxy)   Iron, TIBC and Ferritin Panel   LONG TERM MONITOR (3-14 DAYS)   No orders of the defined types were placed in this encounter.  Referral Orders  No referral(s) requested today     Note is dictated utilizing voice recognition software. Although note has been proof read prior to signing, occasional typographical errors still can be missed. If any  questions arise, please do not hesitate to call for verification.   electronically signed by:  Charlies Bellini, DO  [facility] - [place]

## 2023-12-16 ENCOUNTER — Ambulatory Visit: Payer: Self-pay | Admitting: Family Medicine

## 2023-12-16 DIAGNOSIS — R7989 Other specified abnormal findings of blood chemistry: Secondary | ICD-10-CM

## 2023-12-16 DIAGNOSIS — I471 Supraventricular tachycardia, unspecified: Secondary | ICD-10-CM

## 2023-12-16 LAB — COMPREHENSIVE METABOLIC PANEL WITH GFR
AG Ratio: 1.7 (calc) (ref 1.0–2.5)
ALT: 15 U/L (ref 6–29)
AST: 19 U/L (ref 10–35)
Albumin: 4.3 g/dL (ref 3.6–5.1)
Alkaline phosphatase (APISO): 68 U/L (ref 37–153)
BUN/Creatinine Ratio: 36 (calc) — ABNORMAL HIGH (ref 6–22)
BUN: 27 mg/dL — ABNORMAL HIGH (ref 7–25)
CO2: 29 mmol/L (ref 20–32)
Calcium: 9.2 mg/dL (ref 8.6–10.4)
Chloride: 99 mmol/L (ref 98–110)
Creat: 0.76 mg/dL (ref 0.60–1.00)
Globulin: 2.6 g/dL (ref 1.9–3.7)
Glucose, Bld: 95 mg/dL (ref 65–99)
Potassium: 4.9 mmol/L (ref 3.5–5.3)
Sodium: 133 mmol/L — ABNORMAL LOW (ref 135–146)
Total Bilirubin: 0.4 mg/dL (ref 0.2–1.2)
Total Protein: 6.9 g/dL (ref 6.1–8.1)
eGFR: 81 mL/min/1.73m2 (ref >=60)

## 2023-12-16 LAB — B12 AND FOLATE PANEL
Folate: >24 ng/mL
Vitamin B-12: 1184 pg/mL — ABNORMAL HIGH (ref 200–1100)

## 2023-12-16 LAB — CBC WITH DIFFERENTIAL/PLATELET
Absolute Lymphocytes: 2369 {cells}/uL (ref 850–3900)
Absolute Monocytes: 571 {cells}/uL (ref 200–950)
Basophils Absolute: 50 {cells}/uL (ref 0–200)
Basophils Relative: 0.6 %
Eosinophils Absolute: 294 {cells}/uL (ref 15–500)
Eosinophils Relative: 3.5 %
HCT: 41.4 % (ref 35.0–45.0)
Hemoglobin: 14.1 g/dL (ref 11.7–15.5)
MCH: 33.1 pg — ABNORMAL HIGH (ref 27.0–33.0)
MCHC: 34.1 g/dL (ref 32.0–36.0)
MCV: 97.2 fL (ref 80.0–100.0)
MPV: 10.1 fL (ref 7.5–12.5)
Monocytes Relative: 6.8 %
Neutro Abs: 5116 {cells}/uL (ref 1500–7800)
Neutrophils Relative %: 60.9 %
Platelets: 243 Thousand/uL (ref 140–400)
RBC: 4.26 Million/uL (ref 3.80–5.10)
RDW: 11.7 % (ref 11.0–15.0)
Total Lymphocyte: 28.2 %
WBC: 8.4 Thousand/uL (ref 3.8–10.8)

## 2023-12-16 LAB — TSH: TSH: 3.57 m[IU]/L (ref 0.40–4.50)

## 2023-12-16 LAB — VITAMIN D 25 HYDROXY (VIT D DEFICIENCY, FRACTURES): Vit D, 25-Hydroxy: 68 ng/mL (ref 30–100)

## 2023-12-16 LAB — IRON,TIBC AND FERRITIN PANEL
%SAT: 39 % (ref 16–45)
Ferritin: 45 ng/mL (ref 16–288)
Iron: 121 ug/dL (ref 45–160)
TIBC: 311 ug/dL (ref 250–450)

## 2024-01-03 ENCOUNTER — Ambulatory Visit: Payer: Self-pay

## 2024-01-03 ENCOUNTER — Other Ambulatory Visit: Payer: Self-pay | Admitting: Family Medicine

## 2024-01-03 NOTE — Telephone Encounter (Signed)
 Please see triage note sent to [facility] in error

## 2024-01-03 NOTE — Telephone Encounter (Signed)
 FYI Only or Action Required?: FYI only for provider.  Patient was last seen in primary care on 12/15/2023 by [name], DO.  Called Nurse Triage reporting Fatigue.  Symptoms began several weeks ago.  Interventions attempted: Rest, hydration, or home remedies.  Symptoms are: stable.  Triage Disposition: Home Care  Patient/caregiver understands and will follow disposition?: Yes Reason for Disposition  Poor fluid intake probably caused the weakness  Answer Assessment - Initial Assessment Questions Denies chest pain. Patient states she is drinking electrolytes, but is going to the bathroom a lot. Patient denies new or worsening symptoms. Patient wearing ZIO monitor for 14 days, today was last day and has to send in for results. Advised patient to get results of ZIO monitor.   1. DESCRIPTION: Describe how you are feeling.     Feels like heart is skipping a beat, after that happens a few times, gets very fatigued.   2. SEVERITY: How bad is it?  Can you stand and walk?     Yes  3. ONSET: When did these symptoms begin? (e.g., hours, days, weeks, months)     Few weeks ago  4. CAUSE: What do you think is causing the weakness or fatigue? (e.g., not drinking enough fluids, medical problem, trouble sleeping)     Unsure  5. NEW MEDICINES:  Have you started on any new medicines recently? (e.g., opioid pain medicines, benzodiazepines, muscle relaxants, antidepressants, antihistamines, neuroleptics, beta blockers)     Denies  6. OTHER SYMPTOMS: Do you have any other symptoms? (e.g., chest pain, fever, cough, SOB, vomiting, diarrhea, bleeding, other areas of pain)     States can breath, just feels like has to take a lot of deep breaths.  Protocols used: Weakness (Generalized) and Fatigue-A-AH  Copied from CRM [id number]. Topic: Clinical - Red Word Triage >> Jan 03, 2024  9:48 AM Taylor Bolton wrote: Red Word that prompted transfer to Nurse Triage: extreme fatigue due to  irregular heart rate per patient.

## 2024-01-03 NOTE — Telephone Encounter (Signed)
 Results not available at this time  Copied from CRM #[id number]. Topic: General - Other >> Jan 03, 2024  9:51 AM Suzen RAMAN wrote: Reason for CRM: per patient the  at home heart monitor has shown her heart rate irregular everyday and . patient is extremly tired as a result. Patient wanted to make provider aware.

## 2024-01-03 NOTE — Telephone Encounter (Signed)
 Copied from CRM [id number]. Topic: Clinical - Medication Refill >> Jan 03, 2024  9:46 AM Suzen RAMAN wrote: Medication: levothyroxine  (SYNTHROID ) 88 MCG tablet   Has the patient contacted their pharmacy? Yes  This is the patient's preferred pharmacy:  [id number] [place], [place] - [address] [place] [address] Phone: [phone] Fax: [phone]  Is this the correct pharmacy for this prescription? Yes If no, delete pharmacy and type the correct one.   Has the prescription been filled recently? No  Is the patient out of the medication? No  Has the patient been seen for an appointment in the last year OR does the patient have an upcoming appointment? Yes  Can we respond through MyChart? yes  Agent: Please be advised that Rx refills may take up to 3 business days. We ask that you follow-up with your pharmacy.

## 2024-01-05 ENCOUNTER — Other Ambulatory Visit: Payer: Self-pay

## 2024-01-05 MED ORDER — LEVOTHYROXINE SODIUM 88 MCG PO TABS: 88.0000 ug | ORAL_TABLET | Freq: Every day | ORAL | 0 refills | Status: DC

## 2024-01-06 NOTE — Patient Instructions (Signed)

## 2024-01-06 NOTE — Progress Notes (Unsigned)
 Subjective:   Taylor Bolton is a 78 y.o. female who presents for an Initial Medicare Annual Wellness Visit.  Visit Complete: {VISITMETHODVS:[phone]}  Patient Medicare AWV questionnaire was completed by the patient on ***; I have confirmed that all information answered by patient is correct and no changes since this date.        Objective:    There were no vitals filed for this visit. There is no height or weight on file to calculate BMI.      No data to display          Current Medications (verified) Outpatient Encounter Medications as of 01/07/2024  Medication Sig   Acetylcysteine (NAC PO) Take 900 mg by mouth daily.   Cholecalciferol (VITAMIN D -3 PO) Take 5,000 Units by mouth daily.   DHA-EPA-Eve Prim Oil-Vit E (EPA/GLA PO) Take by mouth daily. (Contains Flaxseed oil, Borage oil, Fish oil, Vit E)   Digestive Enzymes (DIGESTIVE ENZYME PO) Take by mouth 2 (two) times daily with a meal.   levothyroxine  (SYNTHROID ) 88 MCG tablet Take 1 tablet (88 mcg total) by mouth daily before breakfast.   Lutein-Zeaxanthin (VITEYES ESSENTIALS VISION SUPP PO) Take by mouth daily.   Multiple Vitamin (MULTIVITAMIN ADULT PO) Take by mouth 2 (two) times daily.   OVER THE COUNTER MEDICATION Take by mouth daily. Thyroid  Metabolism + Iodine   OVER THE COUNTER MEDICATION Take by mouth in the morning, at noon, and at bedtime. Bone Up (Boron, Calcium, Collagen, Copper, Magnesium, Manganese, Potassium, Silicon, Vit C, Vit D, Vit K, Zinc)   UBIQUINOL PO Take 100 mg by mouth daily.   No facility-administered encounter medications on file as of 01/07/2024.    Allergies (verified) Almond (diagnostic), Fluconazole, Influenza vaccines, Shrimp (diagnostic), Sporanox [itraconazole], and Sudafed [pseudoephedrine hcl]   History: Past Medical History:  Diagnosis Date   Abnormal Pap smear of cervix 11/02/2016   ASCUS, HPV neg.  Repeat pap 1 yr per GYN   Adrenal gland dysfunction    Allergic rhinitis     Perennial; with PND; treats with natural supplements   Fatigue    ? fibromyalgia per old records   Fibrocystic breast disease    Food allergy    +sensitivities (almonds)   History of intestinal parasite    giardia and roundworms   Hypothyroidism    managed by her naturopath   Insomnia    Intestinal candidiasis    Osteopenia    Pelvic congestion syndrome    Postmenopausal bleeding 10/2016   Endomet bx neg; pelvic sonohistogram to follow.  Estrogen/HRT to be weened off as of 10/2016 b/c she took it for > 23yrs and risk outweigh benefits.   Rash and nonspecific skin eruption    Initially treated as allergic dermatitis but didn't help; then her integrative med MD dx'd it as some sort of yeast infection and it cleared up with diflucan   Vitamin D  deficiency    Xerosis of skin    Past Surgical History:  Procedure Laterality Date   DILATION AND CURETTAGE OF UTERUS     ENDOMETRIAL BIOPSY  11/02/2016   Benign.  Sonohistogram ordered by GYN as f/u to this (dx postmenopausal vag bleeding).   TONSILLECTOMY AND ADENOIDECTOMY  1952   Family History  Problem Relation Age of Onset   Breast cancer Mother        dx'd in her 71s.   Prostate cancer Father    Cancer Father        prostate   Arthritis  Sister    Stroke Maternal Grandmother    Hypertension Son    Social History   Socioeconomic History   Marital status: Married    Spouse name: Not on file   Number of children: 2   Years of education: Not on file   Highest education level: High school graduate  Occupational History   Not on file  Tobacco Use   Smoking status: Never   Smokeless tobacco: Never   Tobacco comments:    as teenager  Vaping Use   Vaping status: Never Used  Substance and Sexual Activity   Alcohol use: No   Drug use: No   Sexual activity: Yes  Other Topics Concern   Not on file  Social History Narrative   Married, 1 son and 1 daughter.  1 grandson.   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     -smoke alarm in the home:Yes     - wears seatbelt: Yes     - Feels safe in their relationships: Yes      Social Drivers of Corporate Investment Banker Strain: Not on file  Food Insecurity: Not on file  Transportation Needs: Not on file  Physical Activity: Not on file  Stress: Not on file  Social Connections: Not on file    Tobacco Counseling Counseling given: Not Answered Tobacco comments: as teenager   Clinical Intake:                        Activities of Daily Living     No data to display          Patient Care Team: Catherine Charlies LABOR, DO as PCP - General (Family Medicine) Abigail Maude POUR (Optometry) Gasper Alm CHRISTELLA Raddle., DMD (Dentistry) Gail Bergeron, MD as Referring Physician  Indicate any recent Medical Services you may have received from other than [facility] providers in the past year (date may be approximate).     Assessment:   This is a routine wellness examination for [employer].  Hearing/Vision screen No results found.   Goals Addressed   None    Depression Screen    09/09/2023   10:12 AM 09/08/2022    9:31 AM 03/24/2015   10:45 AM 03/10/2015   11:01 AM 06/29/2014    9:42 AM  PHQ 2/9 Scores  PHQ - 2 Score 0 0 0 0 0    Fall Risk    09/08/2022    9:31 AM 06/29/2014    9:42 AM  Fall Risk   Falls in the past year? 0 No   Number falls in past yr: 0   Injury with Fall? 0   Follow up Falls evaluation completed      Data saved with a previous flowsheet row definition    MEDICARE RISK AT HOME:    TIMED UP AND GO:  Was the test performed? {AMBTIMEDUPGO:[phone]}    Cognitive Function:        Immunizations Immunization History  Administered Date(s) Administered   Tdap 09/11/2010    {TDAP status:2101805}  {Flu Vaccine status:2101806}  {Pneumococcal vaccine status:2101807}  {Covid-19 vaccine status:2101808}  Qualifies for Shingles Vaccine? {YES/NO:21197}  Zostavax completed {YES/NO:21197}  {Shingrix  Completed?:2101804}  Screening Tests Health Maintenance  Topic Date Due   DTaP/Tdap/Td (2 - Td or Tdap) 09/10/2020   Medicare Annual Wellness (AWV)  09/08/2023   DEXA SCAN  Completed   Meningococcal B Vaccine  Aged Out   Pneumococcal  Vaccine: 50+ Years  Discontinued   Mammogram  Discontinued   COVID-19 Vaccine  Discontinued   Hepatitis C Screening  Discontinued   Fecal DNA (Cologuard)  Discontinued   Zoster Vaccines- Shingrix  Discontinued    Health Maintenance  Health Maintenance Due  Topic Date Due   DTaP/Tdap/Td (2 - Td or Tdap) 09/10/2020   Medicare Annual Wellness (AWV)  09/08/2023    {Colorectal cancer screening:2101809}  {Mammogram status:21018020}  {Bone Density status:21018021}  Lung Cancer Screening: (Low Dose CT Chest recommended if Age 58-80 years, 20 pack-year currently smoking OR have quit w/in 15years.) {DOES NOT does:27190::does not} qualify.   Lung Cancer Screening Referral: ***  Additional Screening:  Hepatitis C Screening: {DOES NOT does:27190::does not} qualify; Completed ***  Vision Screening: Recommended annual ophthalmology exams for early detection of glaucoma and other disorders of the eye. Is the patient up to date with their annual eye exam?  {YES/NO:21197} Who is the provider or what is the name of the office in which the patient attends annual eye exams? *** If pt is not established with a provider, would they like to be referred to a provider to establish care? {YES/NO:21197}.   Dental Screening: Recommended annual dental exams for proper oral hygiene  Diabetic Foot Exam: {Diabetic Foot Exam:2101802}  Community Resource Referral / Chronic Care Management: CRR required this visit?  {YES/NO:21197}  CCM required this visit?  {CCM Required [phone]}     Plan:     I have personally reviewed and noted the following in the patient's chart:   Medical and social history Use of alcohol, tobacco or illicit drugs  Current  medications and supplements including opioid prescriptions. {Opioid Prescriptions:[phone]} Functional ability and status Nutritional status Physical activity Advanced directives List of other physicians Hospitalizations, surgeries, and ER visits in previous 12 months Vitals Screenings to include cognitive, depression, and falls Referrals and appointments  In addition, I have reviewed and discussed with patient certain preventive protocols, quality metrics, and best practice recommendations. A written personalized care plan for preventive services as well as general preventive health recommendations were provided to patient.     Shanda LELON Sharps, CMA   01/06/2024   After Visit Summary: {CHL AMB AWV After Visit [phone]}  Nurse Notes: ***

## 2024-01-07 ENCOUNTER — Encounter: Payer: Self-pay | Admitting: Family Medicine

## 2024-01-07 ENCOUNTER — Ambulatory Visit (INDEPENDENT_AMBULATORY_CARE_PROVIDER_SITE_OTHER): Admitting: Family Medicine

## 2024-01-07 VITALS — BP 108/60 | HR 81 | Temp 97.9°F | Wt 124.0 lb

## 2024-01-07 DIAGNOSIS — R000 Tachycardia, unspecified: Secondary | ICD-10-CM | POA: Insufficient documentation

## 2024-01-07 DIAGNOSIS — Z0000 Encounter for general adult medical examination without abnormal findings: Secondary | ICD-10-CM

## 2024-01-07 DIAGNOSIS — R350 Frequency of micturition: Secondary | ICD-10-CM | POA: Insufficient documentation

## 2024-01-07 DIAGNOSIS — G479 Sleep disorder, unspecified: Secondary | ICD-10-CM | POA: Diagnosis not present

## 2024-01-07 DIAGNOSIS — R5383 Other fatigue: Secondary | ICD-10-CM | POA: Diagnosis not present

## 2024-01-07 NOTE — Progress Notes (Signed)
 Taylor Bolton , [birth year], 78 y.o., female MRN: [id number] Patient Care Team    Relationship Specialty Notifications Start End  Catherine Charlies LABOR, DO PCP - General Family Medicine  09/08/22   Abigail Maude POUR  Optometry  09/03/22   Gasper Alm CHRISTELLA Raddle., DMD  Dentistry  09/03/22   Gail Bergeron, MD Referring Physician   09/08/22    Comment: Functional intergrade of medicine    Chief Complaint  Patient presents with   Urinary Frequency    Worsening over the last few months; Pt feels as soon as she drinks anything she immediately as to relieve herself. Pt feels this is why she is so fatigued.      Subjective: Taylor Bolton is a 78 y.o. Pt presents for an OV with complaints of increased fatigue since September 2025. Associated symptoms include feels drained all the time.  Patient has history of hypothyroidism and reports compliance with levothyroxine  88 mcg daily.  TSH was normal 12/15/2023. Patient reports she has been having episodes of heart racing, which creates her feeling winded.  She states she is not short of breath, she just does not feel like she can take in a deep breath and she is fatigued.  She denies chest pain, dizziness, syncope.  Zio monitoring is waiting to be read by cardiology.  Patient reports she does not sleep well at night.  She has difficulty falling asleep.  She wakes multiple times throughout the night to go to urinate. Patient reports she has noticed she is urinating more frequently.  She denies dysuria.  He has been working on her hydration and electrolytes that she has been drinking more.  She takes melatonin on occasion.  Today she reports she was treated in the past for adrenal fatigue, by an integrative medicine doctor.  She states she was treated with low-dose corticosteroids.  She reports she thinks she was on the borderline of having Addison's disease. She did endorse during follow-up appointment 3 weeks ago she felt like her symptoms might of present  secondary to dealing with more stress and felt maybe she was pushing herself too far.    Last labs July 2025: Vitamin D  normal, CBC normal, TSH normal, negative hepatitis C screening, mildly low glucose at 63 with a A1c of 6.1.  CMP normal Labs 12/15/2023 Vitamin D , B12 and iron panel within normal limits TSH within normal limits CBC within normal limits CMP mild decrease in sodium at 133, otherwise normal limits.      09/09/2023   10:12 AM 09/08/2022    9:31 AM 03/24/2015   10:45 AM 03/10/2015   11:01 AM 06/29/2014    9:42 AM  Depression screen PHQ 2/9  Decreased Interest 0 0 0 0 0  Down, Depressed, Hopeless 0 0 0 0 0  PHQ - 2 Score 0 0 0 0 0    Allergies  Allergen Reactions   Almond (Diagnostic)    Fluconazole Other (See Comments)    unspecified   Influenza Vaccines Other (See Comments)    unspecified   Shrimp (Diagnostic) Swelling   Sporanox [Itraconazole] Other (See Comments)    unspecified   Sudafed [Pseudoephedrine Hcl]    Social History   Social History Narrative   Married, 1 son and 1 daughter.  1 grandson.   Retired interior and spatial designer.  [employer] [place] [id number].     -smoke alarm in the home:Yes     - wears seatbelt: Yes     -  Feels safe in their relationships: Yes      Past Medical History:  Diagnosis Date   Abnormal Pap smear of cervix 11/02/2016   ASCUS, HPV neg.  Repeat pap 1 yr per GYN   Adrenal gland dysfunction    Allergic rhinitis    Perennial; with PND; treats with natural supplements   Fatigue    ? fibromyalgia per old records   Fibrocystic breast disease    Food allergy    +sensitivities (almonds)   History of intestinal parasite    giardia and roundworms   Hypothyroidism    managed by her naturopath   Insomnia    Intestinal candidiasis    Osteopenia    Pelvic congestion syndrome    Postmenopausal bleeding 10/2016   Endomet bx neg; pelvic sonohistogram to follow.  Estrogen/HRT to be weened off as of 10/2016 b/c she took it for >  34yrs and risk outweigh benefits.   Rash and nonspecific skin eruption    Initially treated as allergic dermatitis but didn't help; then her integrative med MD dx'd it as some sort of yeast infection and it cleared up with diflucan   Vitamin D  deficiency    Xerosis of skin    Past Surgical History:  Procedure Laterality Date   DILATION AND CURETTAGE OF UTERUS     ENDOMETRIAL BIOPSY  11/02/2016   Benign.  Sonohistogram ordered by GYN as f/u to this (dx postmenopausal vag bleeding).   TONSILLECTOMY AND ADENOIDECTOMY  1952   Family History  Problem Relation Age of Onset   Breast cancer Mother        dx'd in her 48s.   Prostate cancer Father    Cancer Father        prostate   Arthritis Sister    Stroke Maternal Grandmother    Hypertension Son    Allergies as of 01/07/2024       Reactions   Almond (diagnostic)    Fluconazole Other (See Comments)   unspecified   Influenza Vaccines Other (See Comments)   unspecified   Shrimp (diagnostic) Swelling   Sporanox [itraconazole] Other (See Comments)   unspecified   Sudafed [pseudoephedrine Hcl]         Medication List        Accurate as of January 07, 2024  2:45 PM. If you have any questions, ask your nurse or doctor.          DIGESTIVE ENZYME PO Take by mouth 2 (two) times daily with a meal.   EPA/GLA PO Take by mouth daily. (Contains Flaxseed oil, Borage oil, Fish oil, Vit E)   levothyroxine  88 MCG tablet Commonly known as: SYNTHROID  Take 1 tablet (88 mcg total) by mouth daily before breakfast.   MULTIVITAMIN ADULT PO Take by mouth 2 (two) times daily.   NAC PO Take 900 mg by mouth daily.   OVER THE COUNTER MEDICATION Take by mouth daily. Thyroid  Metabolism + Iodine   OVER THE COUNTER MEDICATION Take by mouth in the morning, at noon, and at bedtime. Bone Up (Boron, Calcium, Collagen, Copper, Magnesium, Manganese, Potassium, Silicon, Vit C, Vit D, Vit K, Zinc)   UBIQUINOL PO Take 100 mg by mouth daily.    VITAMIN D -3 PO Take 5,000 Units by mouth daily.   VITEYES ESSENTIALS VISION SUPP PO Take by mouth daily.        All past medical history, surgical history, allergies, family history, immunizations andmedications were updated in the EMR today and reviewed under the history  and medication portions of their EMR.     Review of Systems  All other systems reviewed and are negative.  Negative, with the exception of above mentioned in HPI   Objective:  BP 108/60   Pulse 81   Temp 97.9 F (36.6 C)   Wt 124 lb (56.2 kg)   SpO2 97%   BMI 21.28 kg/m  Body mass index is 21.28 kg/m. Physical Exam Vitals and nursing note reviewed.  Constitutional:      General: She is not in acute distress.    Appearance: Normal appearance. She is not ill-appearing, toxic-appearing or diaphoretic.  HENT:     Head: Normocephalic and atraumatic.  Eyes:     General: No scleral icterus.       Right eye: No discharge.        Left eye: No discharge.     Extraocular Movements: Extraocular movements intact.     Conjunctiva/sclera: Conjunctivae normal.     Pupils: Pupils are equal, round, and reactive to light.  Cardiovascular:     Rate and Rhythm: Normal rate and regular rhythm.     Heart sounds: No murmur heard. Pulmonary:     Effort: Pulmonary effort is normal. No respiratory distress.     Breath sounds: Normal breath sounds. No wheezing, rhonchi or rales.  Musculoskeletal:     Cervical back: Neck supple.     Right lower leg: No edema.     Left lower leg: No edema.  Lymphadenopathy:     Cervical: No cervical adenopathy.  Skin:    General: Skin is warm.     Findings: No rash.  Neurological:     Mental Status: She is alert and oriented to person, place, and time. Mental status is at baseline.     Motor: No weakness.     Gait: Gait normal.  Psychiatric:        Mood and Affect: Mood normal.        Behavior: Behavior normal.        Thought Content: Thought content normal.        Judgment:  Judgment normal.     No results found. No results found. No results found for this or any previous visit (from the past 24 hours).  Assessment/Plan: Taylor Bolton is a 78 y.o. female present for OV for  fatigue -Laboratory workup thus far has been normal. - Awaiting Zio monitoring reading. - Patient reports history of adrenal fatigued and states she was treated with a short-term course of steroids to give her adrenals a break.  Will check an a.m. cortisol level-Future lab.  If appropriate we will refer to endocrinology. - Ambulatory referral to Cardiology-racing heart and fatigue - Basic Metabolic Panel (BMET) - Cortisol; Future -Patient reports taking magnesium-mag levels Future with cortisol level  Sleep disturbance: Currently taking magnesium-mag levels collected Does not want to try prescribed medication to help with sleep Will go back to trying melatonin again. Declines referral to a sleep specialist  Urinary frequency We will check urine today, if appropriate will treat with antibiotics. We discussed medications to help with urinary frequency for overactive bladder and she does not want to prescribe medicine today. - POCT Urinalysis Dipstick (Automated) - Urinalysis w microscopic + reflex cultur  Racing heart beat Patient reports racing heartbeat.  Zio monitoring is in cardiology queue to be read. Exam is normal without any abnormality on auscultation. Cardiology referral placed.   Reviewed expectations re: course of current medical issues. Discussed self-management of  symptoms. Outlined signs and symptoms indicating need for more acute intervention. Patient verbalized understanding and all questions were answered. Patient received an After-Visit Summary.    Orders Placed This Encounter  Procedures   Urinalysis w microscopic + reflex cultur   Basic Metabolic Panel (BMET)   Cortisol   Magnesium   Ambulatory referral to Cardiology   POCT Urinalysis Dipstick  (Automated)   No orders of the defined types were placed in this encounter.  Referral Orders         Ambulatory referral to Cardiology       Note is dictated utilizing voice recognition software. Although note has been proof read prior to signing, occasional typographical errors still can be missed. If any questions arise, please do not hesitate to call for verification.   electronically signed by:  Charlies Bellini, DO  [facility] [place]

## 2024-01-08 DIAGNOSIS — R000 Tachycardia, unspecified: Secondary | ICD-10-CM | POA: Diagnosis not present

## 2024-01-08 DIAGNOSIS — R5383 Other fatigue: Secondary | ICD-10-CM | POA: Diagnosis not present

## 2024-01-08 DIAGNOSIS — I4719 Other supraventricular tachycardia: Secondary | ICD-10-CM | POA: Diagnosis not present

## 2024-01-08 LAB — URINALYSIS W MICROSCOPIC + REFLEX CULTURE
Bacteria, UA: NONE SEEN /HPF
Bilirubin Urine: NEGATIVE
Glucose, UA: NEGATIVE
Hgb urine dipstick: NEGATIVE
Hyaline Cast: NONE SEEN /LPF
Ketones, ur: NEGATIVE
Leukocyte Esterase: NEGATIVE
Nitrites, Initial: NEGATIVE
Protein, ur: NEGATIVE
RBC / HPF: NONE SEEN /HPF (ref 0–2)
Specific Gravity, Urine: 1.006 (ref 1.001–1.035)
Squamous Epithelial / HPF: NONE SEEN /HPF (ref <=5)
WBC, UA: NONE SEEN /HPF (ref 0–5)
pH: 6.5 (ref 5.0–8.0)

## 2024-01-08 LAB — BASIC METABOLIC PANEL WITH GFR
BUN: 15 mg/dL (ref 7–25)
CO2: 27 mmol/L (ref 20–32)
Calcium: 9.3 mg/dL (ref 8.6–10.4)
Chloride: 98 mmol/L (ref 98–110)
Creat: 0.77 mg/dL (ref 0.60–1.00)
Glucose, Bld: 101 mg/dL — ABNORMAL HIGH (ref 65–99)
Potassium: 4.7 mmol/L (ref 3.5–5.3)
Sodium: 133 mmol/L — ABNORMAL LOW (ref 135–146)
eGFR: 80 mL/min/1.73m2 (ref >=60)

## 2024-01-08 LAB — NO CULTURE INDICATED

## 2024-01-10 ENCOUNTER — Ambulatory Visit: Payer: Self-pay | Admitting: Family Medicine

## 2024-01-10 ENCOUNTER — Other Ambulatory Visit (INDEPENDENT_AMBULATORY_CARE_PROVIDER_SITE_OTHER)

## 2024-01-10 DIAGNOSIS — R000 Tachycardia, unspecified: Secondary | ICD-10-CM

## 2024-01-10 DIAGNOSIS — G479 Sleep disorder, unspecified: Secondary | ICD-10-CM

## 2024-01-10 DIAGNOSIS — R5383 Other fatigue: Secondary | ICD-10-CM | POA: Diagnosis not present

## 2024-01-10 LAB — MAGNESIUM: Magnesium: 2 mg/dL (ref 1.5–2.5)

## 2024-01-10 LAB — CORTISOL: Cortisol, Plasma: 13.5 ug/dL

## 2024-01-11 MED ORDER — METOPROLOL TARTRATE 25 MG PO TABS: 12.5000 mg | ORAL_TABLET | Freq: Two times a day (BID) | ORAL | 3 refills | Status: AC

## 2024-01-11 NOTE — Telephone Encounter (Signed)
 Please call patient Taylor Bolton levels are borderline.  I have referred her to endocrinology, which specializes in conditions such as Taylor Bolton/adrenal conditions.  They will need to do further testing to see if she has adrenal insufficiency.   Her Zio monitoring resulted with 30 episodes of SVT which is a increased heart rate. I have referred her to cardiology for further evaluation. She does not have a sustained arrhythmia such as atrial fibrillation. I have called in a medication called metoprolol, and half a tab every 12 hours.  This helps keep the heart rate controlled. When she interacted with the monitor stating she was having symptoms, the majority of the time she was in a normal heart rhythm.  She will be contacted by both endocrinology and cardiology to further evaluate these conditions. Hopefully starting the metoprolol twice a day would help get some of her energy back by decrease SVT episodes.

## 2024-02-01 ENCOUNTER — Other Ambulatory Visit: Payer: Self-pay | Admitting: Family Medicine

## 2024-02-17 ENCOUNTER — Telehealth: Payer: Self-pay | Admitting: Family Medicine

## 2024-02-17 DIAGNOSIS — R7989 Other specified abnormal findings of blood chemistry: Secondary | ICD-10-CM

## 2024-02-17 DIAGNOSIS — R000 Tachycardia, unspecified: Secondary | ICD-10-CM

## 2024-02-17 DIAGNOSIS — R5383 Other fatigue: Secondary | ICD-10-CM

## 2024-02-17 DIAGNOSIS — G479 Sleep disorder, unspecified: Secondary | ICD-10-CM

## 2024-02-17 NOTE — Telephone Encounter (Signed)
 Endocrinology referral declined by Dr. Motwani for cortisol 8 AM levels <15 with a history of adrenal fatigue by patient with symptoms of generalized muscle weakness and fatigue. New referral placed

## 2024-05-02 ENCOUNTER — Ambulatory Visit (HOSPITAL_BASED_OUTPATIENT_CLINIC_OR_DEPARTMENT_OTHER): Admitting: Cardiology

## 2024-09-11 ENCOUNTER — Encounter: Admitting: Family Medicine
